# Patient Record
Sex: Female | Born: 1975
Health system: Southern US, Community
[De-identification: ages and names within clinical notes are randomized; demographics above are authoritative.]

## PROBLEM LIST (undated history)

## (undated) DIAGNOSIS — K8020 Calculus of gallbladder without cholecystitis without obstruction: Secondary | ICD-10-CM

## (undated) DIAGNOSIS — G8929 Other chronic pain: Secondary | ICD-10-CM

## (undated) DIAGNOSIS — R7989 Other specified abnormal findings of blood chemistry: Secondary | ICD-10-CM

## (undated) DIAGNOSIS — R109 Unspecified abdominal pain: Secondary | ICD-10-CM

## (undated) DIAGNOSIS — R945 Abnormal results of liver function studies: Secondary | ICD-10-CM

## (undated) HISTORY — PX: TUBAL LIGATION: SHX77

---

## 2013-03-25 ENCOUNTER — Ambulatory Visit: Payer: Self-pay | Admitting: General Practice

## 2013-08-12 ENCOUNTER — Encounter: Payer: Self-pay | Admitting: *Deleted

## 2013-09-15 ENCOUNTER — Ambulatory Visit: Payer: Self-pay | Admitting: Gastroenterology

## 2013-10-24 ENCOUNTER — Encounter: Payer: Self-pay | Admitting: Gastroenterology

## 2013-10-24 ENCOUNTER — Ambulatory Visit (INDEPENDENT_AMBULATORY_CARE_PROVIDER_SITE_OTHER): Payer: Self-pay | Admitting: Gastroenterology

## 2013-10-24 ENCOUNTER — Encounter (INDEPENDENT_AMBULATORY_CARE_PROVIDER_SITE_OTHER): Payer: Self-pay

## 2013-10-24 VITALS — BP 117/70 | HR 81 | Temp 98.4°F | Ht 61.0 in | Wt 158.2 lb

## 2013-10-24 DIAGNOSIS — R945 Abnormal results of liver function studies: Principal | ICD-10-CM

## 2013-10-24 DIAGNOSIS — R7989 Other specified abnormal findings of blood chemistry: Secondary | ICD-10-CM | POA: Insufficient documentation

## 2013-10-24 NOTE — Progress Notes (Signed)
      Primary Care Physician:  Jacquelin Hawking, PA Primary Gastroenterologist:  Dr. Darrick Penna   Chief Complaint  Patient presents with  . ELEVATED LIVER TESTS    HPI:   Laura Anderson is a pleasant 38 year old female presenting at the request of Jacquelin Hawking, [place], secondary to elevated LFTs. 2014 AST 58, ALT 72. May 2015 AST 77, ALT 100. Viral markers negative. Rare Aleve for headache. Sometimes chamomile tea. No OTC herbal supplements. No abdominal pain, N/V, loss of appetite, weight loss, upper or lower GI symptoms. No prior history of drug abuse, tattoos, blood transfusions.   Past Medical History  Diagnosis Date  . Medical history non-contributory     Past Surgical History  Procedure Laterality Date  . Tubal ligation      No current outpatient prescriptions on file.   No current facility-administered medications for this visit.    Allergies as of 10/24/2013  . (No Known Allergies)    Family History  Problem Relation Age of Onset  . Colon cancer Neg Hx   . Liver disease Neg Hx     History   Social History  . Marital Status: Unknown    Spouse Name: N/A    Number of Children: 5  . Years of Education: N/A   Occupational History  . Housewife    Social History Main Topics  . Smoking status: Never Smoker   . Smokeless tobacco: Not on file     Comment: NEVER SMOKED  . Alcohol Use: Yes     Comment: socially  . Drug Use: No  . Sexual Activity: Not on file   Other Topics Concern  . Not on file   Social History Narrative   5 children. Oldest is 23. Youngest 9.     Review of Systems: As mentioned in HPI  Physical Exam: BP 117/70  Pulse 81  Temp(Src) 98.4 F (36.9 C) (Oral)  Ht  (1.549 m)  Wt 158 lb 3.2 oz (71.759 kg)  BMI 29.91 kg/m2  LMP 10/10/2013 General:   Alert and oriented. Pleasant and cooperative. Well-nourished and well-developed.  Head:  Normocephalic and atraumatic. Eyes:  Without icterus, sclera clear and conjunctiva pink.    Ears:  Normal auditory acuity. Nose:  No deformity, discharge,  or lesions. Mouth:  No deformity or lesions, oral mucosa pink.  Lungs:  Clear to auscultation bilaterally. No wheezes, rales, or rhonchi. No distress.  Heart:  S1, S2 present without murmurs appreciated.  Abdomen:  +BS, soft, non-tender and non-distended. Liver margin smooth, palpable 2-3 cm below right subcostal margin.. No guarding or rebound. No masses appreciated.  Rectal:  Deferred  Msk:  Symmetrical without gross deformities. Normal posture. Extremities:  Without clubbing or edema. Neurologic:  Alert and  oriented x4;  grossly normal neurologically. Skin:  Intact without significant lesions or rashes. Cervical Nodes:  No significant cervical adenopathy. Psych:  Alert and cooperative. Normal mood and affect.

## 2013-10-24 NOTE — Assessment & Plan Note (Signed)
[age]year old female with mild persistent elevation in transaminases with negative viral markers. No imaging on file, completely asymptomatic. Awaiting  Assistance. Will proceed with further serologies, [place] of abdomen. Query fatty liver as culprit. Labs to be abstracted. Refer to nutrition to assist with healthy alternatives for food while still keeping with cultural practices.

## 2013-10-24 NOTE — Patient Instructions (Signed)
I have ordered an ultrasound of your liver for further assessment and blood work.  Follow a low-fat diet and exercise most days of the week for at least 30 minutes. I have referred you to a nutritionist to help with meal ideas.   Fat and Cholesterol Control Diet Fat and cholesterol levels in your blood and organs are influenced by your diet. High levels of fat and cholesterol may lead to diseases of the heart, small and large blood vessels, gallbladder, liver, and pancreas. CONTROLLING FAT AND CHOLESTEROL WITH DIET Although exercise and lifestyle factors are important, your diet is key. That is because certain foods are known to raise cholesterol and others to lower it. The goal is to balance foods for their effect on cholesterol and more importantly, to replace saturated and trans fat with other types of fat, such as monounsaturated fat, polyunsaturated fat, and omega-3 fatty acids. On average, a person should consume no more than 15 to 17 g of saturated fat daily. Saturated and trans fats are considered "bad" fats, and they will raise LDL cholesterol. Saturated fats are primarily found in animal products such as meats, butter, and cream. However, that does not mean you need to give up all your favorite foods. Today, there are good tasting, low-fat, low-cholesterol substitutes for most of the things you like to eat. Choose low-fat or nonfat alternatives. Choose round or loin cuts of red meat. These types of cuts are lowest in fat and cholesterol. Chicken (without the skin), fish, veal, and ground [place] breast are great choices. Eliminate fatty meats, such as hot dogs and salami. Even shellfish have little or no saturated fat. Have a 3 oz (85 g) portion when you eat lean meat, poultry, or fish. Trans fats are also called "partially hydrogenated oils." They are oils that have been scientifically manipulated so that they are solid at room temperature resulting in a longer shelf life and improved taste and  texture of foods in which they are added. Trans fats are found in stick margarine, some tub margarines, cookies, crackers, and baked goods.  When baking and cooking, oils are a great substitute for butter. The monounsaturated oils are especially beneficial since it is believed they lower LDL and raise HDL. The oils you should avoid entirely are saturated tropical oils, such as coconut and palm.  Remember to eat a lot from food groups that are naturally free of saturated and trans fat, including fish, fruit, vegetables, beans, grains (barley, rice, couscous, bulgur wheat), and pasta (without cream sauces).  IDENTIFYING FOODS THAT LOWER FAT AND CHOLESTEROL  Soluble fiber may lower your cholesterol. This type of fiber is found in fruits such as apples, vegetables such as broccoli, potatoes, and carrots, legumes such as beans, peas, and lentils, and grains such as barley. Foods fortified with plant sterols (phytosterol) may also lower cholesterol. You should eat at least 2 g per day of these foods for a cholesterol lowering effect.  Read package labels to identify low-saturated fats, trans fat free, and low-fat foods at the supermarket. Select cheeses that have only 2 to 3 g saturated fat per ounce. Use a heart-healthy tub margarine that is free of trans fats or partially hydrogenated oil. When buying baked goods (cookies, crackers), avoid partially hydrogenated oils. Breads and muffins should be made from whole grains (whole-wheat or whole oat flour, instead of "flour" or "enriched flour"). Buy non-creamy canned soups with reduced salt and no added fats.  FOOD PREPARATION TECHNIQUES  Never deep-fry. If you must  fry, either stir-fry, which uses very little fat, or use non-stick cooking sprays. When possible, broil, bake, or roast meats, and steam vegetables. Instead of putting butter or margarine on vegetables, use lemon and herbs, applesauce, and cinnamon (for squash and sweet potatoes). Use nonfat yogurt,  salsa, and low-fat dressings for salads.  LOW-SATURATED FAT / LOW-FAT FOOD SUBSTITUTES Meats / Saturated Fat (g)  Avoid: Steak, marbled (3 oz/85 g) / 11 g  Choose: Steak, lean (3 oz/85 g) / 4 g  Avoid: Hamburger (3 oz/85 g) / 7 g  Choose: Hamburger, lean (3 oz/85 g) / 5 g  Avoid: Ham (3 oz/85 g) / 6 g  Choose: Ham, lean cut (3 oz/85 g) / 2.4 g  Avoid: Chicken, with skin, dark meat (3 oz/85 g) / 4 g  Choose: Chicken, skin removed, dark meat (3 oz/85 g) / 2 g  Avoid: Chicken, with skin, light meat (3 oz/85 g) / 2.5 g  Choose: Chicken, skin removed, light meat (3 oz/85 g) / 1 g Dairy / Saturated Fat (g)  Avoid: Whole milk (1 cup) / 5 g  Choose: Low-fat milk, 2% (1 cup) / 3 g  Choose: Low-fat milk, 1% (1 cup) / 1.5 g  Choose: Skim milk (1 cup) / 0.3 g  Avoid: Hard cheese (1 oz/28 g) / 6 g  Choose: Skim milk cheese (1 oz/28 g) / 2 to 3 g  Avoid: Cottage cheese, 4% fat (1 cup) / 6.5 g  Choose: Low-fat cottage cheese, 1% fat (1 cup) / 1.5 g  Avoid: Ice cream (1 cup) / 9 g  Choose: Sherbet (1 cup) / 2.5 g  Choose: Nonfat frozen yogurt (1 cup) / 0.3 g  Choose: Frozen fruit bar / trace  Avoid: Whipped cream (1 tbs) / 3.5 g  Choose: Nondairy whipped topping (1 tbs) / 1 g Condiments / Saturated Fat (g)  Avoid: Mayonnaise (1 tbs) / 2 g  Choose: Low-fat mayonnaise (1 tbs) / 1 g  Avoid: Butter (1 tbs) / 7 g  Choose: Extra light margarine (1 tbs) / 1 g  Avoid: Coconut oil (1 tbs) / 11.8 g  Choose: Olive oil (1 tbs) / 1.8 g  Choose: Corn oil (1 tbs) / 1.7 g  Choose: Safflower oil (1 tbs) / 1.2 g  Choose: Sunflower oil (1 tbs) / 1.4 g  Choose: Soybean oil (1 tbs) / 2.4 g  Choose: Canola oil (1 tbs) / 1 g Document Released: 02/13/2005 Document Revised: 06/10/2012 Document Reviewed: 05/14/2013 Exit[employer] Patient Information 2015 [place]. This information is not intended to replace advice given to you by your health care provider. Make sure you discuss any  questions you have with your health care provider.

## 2013-10-29 NOTE — Progress Notes (Signed)
Cc to pcp °

## 2013-11-11 LAB — HEPATITIS B SURFACE ANTIGEN
HEPATITIS B SURFACE ANTIGEN: NEGATIVE
Hep A IgM: NEGATIVE
Hep B C IgM: NEGATIVE
Hepatitis C Ab: NEGATIVE

## 2013-11-11 LAB — COMPREHENSIVE METABOLIC PANEL
ALT: 100
ALT: 72
AST: 58 U/L
AST: 77 U/L

## 2014-01-01 ENCOUNTER — Telehealth: Payer: Self-pay | Admitting: Gastroenterology

## 2014-01-01 NOTE — Telephone Encounter (Signed)
Pt called this morning to let us know that she had been approved for assistance and wanted to know if she could go ahead and be scheduled for whatever testing that needed to be done. Please advise if she needs another OV first. [phone]

## 2014-01-01 NOTE — Telephone Encounter (Signed)
AS what do we need to order for her now that she has [employer] assistance? Please advise

## 2014-01-02 ENCOUNTER — Other Ambulatory Visit: Payer: Self-pay

## 2014-01-02 NOTE — Telephone Encounter (Signed)
Pt is aware to have labs done. Orders have been faxed to the lab

## 2014-01-02 NOTE — Telephone Encounter (Signed)
Good to hear that! Needs an ultrasound of abdomen and the labs completed that are ordered in epic.

## 2014-01-02 NOTE — Telephone Encounter (Signed)
She is also set up for the US on 01/05/14 @ 1045 and she is aware

## 2014-01-05 ENCOUNTER — Other Ambulatory Visit (HOSPITAL_COMMUNITY): Payer: Self-pay

## 2014-01-05 ENCOUNTER — Ambulatory Visit (HOSPITAL_COMMUNITY)
Admission: RE | Admit: 2014-01-05 | Discharge: 2014-01-05 | Disposition: A | Discharge status: Home / Self Care | Payer: Self-pay | Source: Ambulatory Visit | Attending: Gastroenterology | Admitting: Gastroenterology

## 2014-01-05 DIAGNOSIS — R945 Abnormal results of liver function studies: Secondary | ICD-10-CM

## 2014-01-05 DIAGNOSIS — R7989 Other specified abnormal findings of blood chemistry: Secondary | ICD-10-CM | POA: Insufficient documentation

## 2014-01-06 LAB — HEPATIC FUNCTION PANEL
ALT: 27 U/L (ref 0–35)
AST: 22 U/L (ref 0–37)
Albumin: 4.4 g/dL (ref 3.5–5.2)
Alkaline Phosphatase: 76 U/L (ref 39–117)
BILIRUBIN DIRECT: 0.2 mg/dL (ref 0.0–0.3)
BILIRUBIN INDIRECT: 1.1 mg/dL (ref 0.2–1.2)
BILIRUBIN TOTAL: 1.3 mg/dL — AB (ref 0.2–1.2)
Total Protein: 7.9 g/dL (ref 6.0–8.3)

## 2014-01-06 LAB — FERRITIN: Ferritin: 38 ng/mL (ref 10–291)

## 2014-01-06 LAB — IGG, IGA, IGM
IGA: 332 mg/dL (ref 69–380)
IgG (Immunoglobin G), Serum: 1880 mg/dL — ABNORMAL HIGH (ref 690–1700)
IgM, Serum: 172 mg/dL (ref 52–322)

## 2014-01-06 LAB — ANA: ANA: NEGATIVE

## 2014-01-06 LAB — IRON: Iron: 59 ug/dL (ref 42–145)

## 2014-01-06 LAB — MITOCHONDRIAL ANTIBODIES: MITOCHONDRIAL M2 AB, IGG: 0.17 (ref <0.91)

## 2014-01-08 ENCOUNTER — Telehealth: Payer: Self-pay | Admitting: Gastroenterology

## 2014-01-08 NOTE — Telephone Encounter (Signed)
PLEASE CALL PATIENT BACK REGARDING ABDOMINAL PAIN

## 2014-01-08 NOTE — Telephone Encounter (Signed)
Pt is aware that it takes 7-10 business days for results. Said she was just wondering how it would turn out.

## 2014-01-08 NOTE — Telephone Encounter (Signed)
Pt had called earlier for results. She is now asking about the pain she feels on her right side. The pain is just below the breast and goes around to the back area. She said the pain is not bad at all, she is just aware and it is intermittent.  Worsens a little when she is walking or standing. Please advise!

## 2014-01-08 NOTE — Telephone Encounter (Signed)
Pt had labs and U/S done on Tuesday this week and was asking if the results were back yet.. I told her that normally its 7-10 business days, but I would let the nurse know that she said called. [phone]

## 2014-01-09 NOTE — Progress Notes (Signed)
Quick Note:  Transaminases now normal.  IgG slightly elevated. Overall serologies for PBC/autoimmune hepatitis negative.  US abdomen with mobile gallstones. Fatty liver.  Would proceed with HIDA with FATTY MEAL CHALLENGE NOT CCK. ______

## 2014-01-09 NOTE — Progress Notes (Signed)
Quick Note:  Pt is aware and OK for Ginger to schedule the HIDA . ______

## 2014-01-09 NOTE — Telephone Encounter (Signed)
See result note.  

## 2014-01-09 NOTE — Telephone Encounter (Signed)
See result note. I would like a HIDA with fatty meal challenge, not CCK.  Symptoms seem to be more musculoskeletal but need to rule out biliary etiology.

## 2014-01-12 ENCOUNTER — Other Ambulatory Visit: Payer: Self-pay

## 2014-01-12 DIAGNOSIS — K8020 Calculus of gallbladder without cholecystitis without obstruction: Secondary | ICD-10-CM

## 2014-01-12 DIAGNOSIS — K760 Fatty (change of) liver, not elsewhere classified: Secondary | ICD-10-CM

## 2014-01-19 ENCOUNTER — Ambulatory Visit (HOSPITAL_COMMUNITY): Payer: Self-pay

## 2014-01-21 ENCOUNTER — Encounter (HOSPITAL_COMMUNITY): Payer: Self-pay

## 2014-01-21 ENCOUNTER — Other Ambulatory Visit: Payer: Self-pay

## 2014-01-21 ENCOUNTER — Ambulatory Visit (HOSPITAL_COMMUNITY)
Admission: RE | Admit: 2014-01-21 | Discharge: 2014-01-21 | Disposition: A | Discharge status: Home / Self Care | Payer: Self-pay | Source: Ambulatory Visit | Attending: Gastroenterology | Admitting: Gastroenterology

## 2014-01-21 DIAGNOSIS — K8020 Calculus of gallbladder without cholecystitis without obstruction: Secondary | ICD-10-CM | POA: Insufficient documentation

## 2014-01-21 DIAGNOSIS — R748 Abnormal levels of other serum enzymes: Secondary | ICD-10-CM

## 2014-01-21 DIAGNOSIS — R945 Abnormal results of liver function studies: Secondary | ICD-10-CM | POA: Insufficient documentation

## 2014-01-21 DIAGNOSIS — K760 Fatty (change of) liver, not elsewhere classified: Secondary | ICD-10-CM | POA: Insufficient documentation

## 2014-01-21 MED ORDER — TECHNETIUM TC 99M MEBROFENIN IV KIT
5.0000 | PACK | Freq: Once | INTRAVENOUS | Status: AC | PRN
  Administered 2014-01-21: 5 via INTRAVENOUS

## 2014-01-21 NOTE — Progress Notes (Signed)
Quick Note:  Documented biliary dyskinesia. Please send to Dr. Lovell SheehanJenkins for evaluation for cholecystectomy ______

## 2014-03-26 ENCOUNTER — Emergency Department (HOSPITAL_COMMUNITY): Payer: Self-pay

## 2014-03-26 ENCOUNTER — Emergency Department (HOSPITAL_COMMUNITY)
Admission: EM | Admit: 2014-03-26 | Discharge: 2014-03-26 | Disposition: A | Discharge status: Home / Self Care | Payer: Self-pay | Attending: Emergency Medicine | Admitting: Emergency Medicine

## 2014-03-26 ENCOUNTER — Encounter (HOSPITAL_COMMUNITY): Payer: Self-pay | Admitting: Emergency Medicine

## 2014-03-26 DIAGNOSIS — Z3202 Encounter for pregnancy test, result negative: Secondary | ICD-10-CM | POA: Insufficient documentation

## 2014-03-26 DIAGNOSIS — R109 Unspecified abdominal pain: Secondary | ICD-10-CM | POA: Insufficient documentation

## 2014-03-26 DIAGNOSIS — Z9851 Tubal ligation status: Secondary | ICD-10-CM | POA: Insufficient documentation

## 2014-03-26 DIAGNOSIS — Z8719 Personal history of other diseases of the digestive system: Secondary | ICD-10-CM | POA: Insufficient documentation

## 2014-03-26 DIAGNOSIS — G8929 Other chronic pain: Secondary | ICD-10-CM | POA: Insufficient documentation

## 2014-03-26 HISTORY — DX: Calculus of gallbladder without cholecystitis without obstruction: K80.20

## 2014-03-26 HISTORY — DX: Unspecified abdominal pain: R10.9

## 2014-03-26 HISTORY — DX: Other chronic pain: G89.29

## 2014-03-26 HISTORY — DX: Abnormal results of liver function studies: R94.5

## 2014-03-26 HISTORY — DX: Other specified abnormal findings of blood chemistry: R79.89

## 2014-03-26 LAB — COMPREHENSIVE METABOLIC PANEL WITH GFR
ALT: 27 U/L (ref 0–35)
AST: 25 U/L (ref 0–37)
Albumin: 4.4 g/dL (ref 3.5–5.2)
Alkaline Phosphatase: 74 U/L (ref 39–117)
Anion gap: 8 (ref 5–15)
BUN: 12 mg/dL (ref 6–23)
CO2: 25 mmol/L (ref 19–32)
Calcium: 9.2 mg/dL (ref 8.4–10.5)
Chloride: 104 mmol/L (ref 96–112)
Creatinine, Ser: 0.53 mg/dL (ref 0.50–1.10)
GFR calc Af Amer: >90 mL/min
GFR calc non Af Amer: >90 mL/min
Glucose, Bld: 92 mg/dL (ref 70–99)
Potassium: 3.4 mmol/L — ABNORMAL LOW (ref 3.5–5.1)
Sodium: 137 mmol/L (ref 135–145)
Total Bilirubin: 2.3 mg/dL — ABNORMAL HIGH (ref 0.3–1.2)
Total Protein: 8.8 g/dL — ABNORMAL HIGH (ref 6.0–8.3)

## 2014-03-26 LAB — PREGNANCY, URINE: Preg Test, Ur: NEGATIVE

## 2014-03-26 LAB — CBC WITH DIFFERENTIAL/PLATELET
BASOS PCT: 0 % (ref 0–1)
Basophils Absolute: 0 10*3/uL (ref 0.0–0.1)
Eosinophils Absolute: 0.3 10*3/uL (ref 0.0–0.7)
Eosinophils Relative: 3 % (ref 0–5)
HEMATOCRIT: 38 % (ref 36.0–46.0)
Hemoglobin: 13.3 g/dL (ref 12.0–15.0)
LYMPHS PCT: 23 % (ref 12–46)
Lymphs Abs: 1.9 10*3/uL (ref 0.7–4.0)
MCH: 29.5 pg (ref 26.0–34.0)
MCHC: 35 g/dL (ref 30.0–36.0)
MCV: 84.3 fL (ref 78.0–100.0)
MONO ABS: 0.6 10*3/uL (ref 0.1–1.0)
Monocytes Relative: 8 % (ref 3–12)
Neutro Abs: 5.6 10*3/uL (ref 1.7–7.7)
Neutrophils Relative %: 66 % (ref 43–77)
Platelets: 260 10*3/uL (ref 150–400)
RBC: 4.51 MIL/uL (ref 3.87–5.11)
RDW: 12.6 % (ref 11.5–15.5)
WBC: 8.4 10*3/uL (ref 4.0–10.5)

## 2014-03-26 LAB — URINALYSIS, ROUTINE W REFLEX MICROSCOPIC
Bilirubin Urine: NEGATIVE
GLUCOSE, UA: NEGATIVE mg/dL
KETONES UR: 40 mg/dL — AB
NITRITE: NEGATIVE
Protein, ur: NEGATIVE mg/dL
SPECIFIC GRAVITY, URINE: 1.02 (ref 1.005–1.030)
UROBILINOGEN UA: 0.2 mg/dL (ref 0.0–1.0)
pH: 6 (ref 5.0–8.0)

## 2014-03-26 LAB — URINE MICROSCOPIC-ADD ON

## 2014-03-26 LAB — LIPASE, BLOOD: Lipase: 20 U/L (ref 11–59)

## 2014-03-26 MED ORDER — OXYCODONE-ACETAMINOPHEN 5-325 MG PO TABS: ORAL_TABLET | ORAL | Status: DC

## 2014-03-26 MED ORDER — ONDANSETRON HCL 4 MG PO TABS: 4.0000 mg | ORAL_TABLET | Freq: Three times a day (TID) | ORAL | Status: DC | PRN

## 2014-03-26 MED ORDER — OXYCODONE-ACETAMINOPHEN 5-325 MG PO TABS
2.0000 | ORAL_TABLET | Freq: Once | ORAL | Status: AC
  Administered 2014-03-26: 1 via ORAL
  Filled 2014-03-26: qty 2

## 2014-03-26 NOTE — ED Notes (Signed)
Patient state upper right flank pain that started today at 1500. Patient denies NVD or urinary symptoms.

## 2014-03-26 NOTE — ED Notes (Signed)
MD at bedside. 

## 2014-03-26 NOTE — ED Provider Notes (Signed)
CSN: [id number]     Arrival date & time 03/26/14  1836 History   First MD Initiated Contact with Patient 03/26/14 2030     Chief Complaint  Patient presents with  . Flank Pain      HPI Pt was seen at 2030.  Per pt, c/o gradual onset and persistence of constant right sided abd "pain" since 1500 today.  Has been associated with no other symptoms. Describes the abd pain as "stabbing."  Denies pain was related to food intake. Denies injury. Denies N/V, no diarrhea, no fevers, no back pain, no rash, no CP/SOB, no black or blood in stools, no dysuria/hematuria.       Past Medical History  Diagnosis Date  . Chronic abdominal pain   . Elevated LFTs   . Cholelithiasis    Past Surgical History  Procedure Laterality Date  . Tubal ligation     Family History  Problem Relation Age of Onset  . Colon cancer Neg Hx   . Liver disease Neg Hx    History  Substance Use Topics  . Smoking status: Never Smoker   . Smokeless tobacco: Not on file     Comment: NEVER SMOKED  . Alcohol Use: Yes     Comment: socially    Review of Systems ROS: Statement: All systems negative except as marked or noted in the HPI; Constitutional: Negative for fever and chills. ; ; Eyes: Negative for eye pain, redness and discharge. ; ; ENMT: Negative for ear pain, hoarseness, nasal congestion, sinus pressure and sore throat. ; ; Cardiovascular: Negative for chest pain, palpitations, diaphoresis, dyspnea and peripheral edema. ; ; Respiratory: Negative for cough, wheezing and stridor. ; ; Gastrointestinal: +abd pain. Negative for nausea, vomiting, diarrhea, blood in stool, hematemesis, jaundice and rectal bleeding. . ; ; Genitourinary:  Negative for dysuria and hematuria. ; ; Musculoskeletal: Negative for back pain and neck pain. Negative for swelling and trauma.; ; Skin: Negative for pruritus, rash, abrasions, blisters, bruising and skin lesion.; ; Neuro: Negative for headache, lightheadedness and neck stiffness. Negative for  weakness, altered level of consciousness , altered mental status, extremity weakness, paresthesias, involuntary movement, seizure and syncope.      Allergies  Review of patient's allergies indicates no known allergies.  Home Medications   Prior to Admission medications   Medication Sig Start Date End Date Taking? Authorizing Provider  naproxen sodium (ANAPROX) 220 MG tablet Take 220-440 mg by mouth daily as needed (for pain).   Yes Historical Provider, MD   BP 126/79 mmHg  Pulse 78  Temp(Src) 98.8 F (37.1 C) (Oral)  Resp 20  Ht 5\' 1"  (1.549 m)  Wt 151 lb 14.4 oz (68.901 kg)  BMI 28.72 kg/m2  SpO2 100%  LMP 03/26/2014 Physical Exam  2035: Physical examination:  Nursing notes reviewed; Vital signs and O2 SAT reviewed;  Constitutional: Well developed, Well nourished, Well hydrated, In no acute distress; Head:  Normocephalic, atraumatic; Eyes: EOMI, PERRL, No scleral icterus; ENMT: Mouth and pharynx normal, Mucous membranes moist; Neck: Supple, Full range of motion, No lymphadenopathy; Cardiovascular: Regular rate and rhythm, No murmur, rub, or gallop; Respiratory: Breath sounds clear & equal bilaterally, No rales, rhonchi, wheezes.  Speaking full sentences with ease, Normal respiratory effort/excursion; Chest: Nontender, Movement normal; Abdomen: Soft, +right sided torso tenderness to palp. No rash. No abd tenderness. Negative Murphy's sign. Nondistended, Normal bowel sounds; Genitourinary: No CVA tenderness; Spine:  No midline CS, TS, LS tenderness. No rash.;; Extremities: Pulses normal, No tenderness, No  edema, No calf edema or asymmetry.; Neuro: AA&Ox3, Major CN grossly intact.  Speech clear. No gross focal motor or sensory deficits in extremities. Climbs on and off stretcher easily by herself. Gait steady.; Skin: Color normal, Warm, Dry.   ED Course  Procedures     EKG Interpretation None      MDM  MDM Reviewed: previous chart, nursing note and vitals Reviewed previous:  labs Interpretation: labs and x-ray   Results for orders placed or performed during the hospital encounter of 03/26/14  CBC with Differential  Result Value Ref Range   WBC 8.4 4.0 - 10.5 K/uL   RBC 4.51 3.87 - 5.11 MIL/uL   Hemoglobin 13.3 12.0 - 15.0 g/dL   HCT 86.5 78.4 - 69.6 %   MCV 84.3 78.0 - 100.0 fL   MCH 29.5 26.0 - 34.0 pg   MCHC 35.0 30.0 - 36.0 g/dL   RDW 29.5 28.4 - 13.2 %   Platelets 260 150 - 400 K/uL   Neutrophils Relative % 66 43 - 77 %   Neutro Abs 5.6 1.7 - 7.7 K/uL   Lymphocytes Relative 23 12 - 46 %   Lymphs Abs 1.9 0.7 - 4.0 K/uL   Monocytes Relative 8 3 - 12 %   Monocytes Absolute 0.6 0.1 - 1.0 K/uL   Eosinophils Relative 3 0 - 5 %   Eosinophils Absolute 0.3 0.0 - 0.7 K/uL   Basophils Relative 0 0 - 1 %   Basophils Absolute 0.0 0.0 - 0.1 K/uL  Comprehensive metabolic panel  Result Value Ref Range   Sodium 137 135 - 145 mmol/L   Potassium 3.4 (L) 3.5 - 5.1 mmol/L   Chloride 104 96 - 112 mmol/L   CO2 25 19 - 32 mmol/L   Glucose, Bld 92 70 - 99 mg/dL   BUN 12 6 - 23 mg/dL   Creatinine, Ser 4.40 0.50 - 1.10 mg/dL   Calcium 9.2 8.4 - 10.2 mg/dL   Total Protein 8.8 (H) 6.0 - 8.3 g/dL   Albumin 4.4 3.5 - 5.2 g/dL   AST 25 0 - 37 U/L   ALT 27 0 - 35 U/L   Alkaline Phosphatase 74 39 - 117 U/L   Total Bilirubin 2.3 (H) 0.3 - 1.2 mg/dL   GFR calc non Af Amer >90 >90 mL/min   GFR calc Af Amer >90 >90 mL/min   Anion gap 8 5 - 15  Urinalysis, Routine w reflex microscopic  Result Value Ref Range   Color, Urine YELLOW YELLOW   APPearance CLEAR CLEAR   Specific Gravity, Urine 1.020 1.005 - 1.030   pH 6.0 5.0 - 8.0   Glucose, UA NEGATIVE NEGATIVE mg/dL   Hgb urine dipstick LARGE (A) NEGATIVE   Bilirubin Urine NEGATIVE NEGATIVE   Ketones, ur 40 (A) NEGATIVE mg/dL   Protein, ur NEGATIVE NEGATIVE mg/dL   Urobilinogen, UA 0.2 0.0 - 1.0 mg/dL   Nitrite NEGATIVE NEGATIVE   Leukocytes, UA SMALL (A) NEGATIVE  Pregnancy, urine  Result Value Ref Range   Preg  Test, Ur NEGATIVE NEGATIVE  Lipase, blood  Result Value Ref Range   Lipase 20 11 - 59 U/L  Urine microscopic-add on  Result Value Ref Range   Squamous Epithelial / LPF FEW (A) RARE   WBC, UA 0-2 <3 WBC/hpf   RBC / HPF 11-20 <3 RBC/hpf   Urine-Other AMORPHOUS URATES/PHOSPHATES    Ct Renal Stone Study 03/26/2014   CLINICAL DATA:  Initial evaluation for upper right  flank pain since 3 p.m. today. History of cholelithiasis common chronic abdominal pain, elevated LFTs. Also tubal ligation.  EXAM: CT ABDOMEN AND PELVIS WITHOUT CONTRAST  TECHNIQUE: Multidetector CT imaging of the abdomen and pelvis was performed following the standard protocol without IV contrast.  COMPARISON:  Prior ultrasound from 01/05/2014.  FINDINGS: Visualized lung bases are clear. No pleural or pericardial effusion.  Limited noncontrast evaluation of the liver is unremarkable. Multiple stones present within the gallbladder lumen. No CT evidence for acute cholecystitis. No biliary dilatation. The spleen, adrenal glands, and pancreas demonstrate a normal unenhanced appearance.  Kidneys are equal size without evidence of nephrolithiasis or hydronephrosis. No stones seen along the course of either renal collecting system. There is no hydroureter. He  Stomach and within normal limits. No evidence for bowel obstruction. No abnormal wall thickening or inflammatory changes seen about the bowels. Appendix visualized in the right lower quadrant and is of normal caliber and appearance without associated inflammatory changes to suggest acute appendicitis.  Bladder within normal limits.  Uterus and ovaries are normal.  No free air or fluid.  No adenopathy.  No acute osseus abnormality. No worrisome lytic or blastic osseous lesion.  IMPRESSION: 1. No CT evidence for nephrolithiasis or obstructive uropathy. 2. Cholelithiasis without evidence for acute cholecystitis or biliary dilatation. 3. No other acute intra-abdominal or pelvic process. 4. Normal  appendix.   Electronically Signed   By: Rise Mu M.D.   On: 03/26/2014 21:20    2140:  Pt has tol PO well while in the ED without N/V.  No stooling while in the ED.  Abd remains benign, VSS. Feels better and wants to go home now. Workup reassuring. Tx symptomatically at this time. Dx and testing d/w pt and family.  Questions answered.  Verb understanding, agreeable to d/c home with outpt f/u.     Samuel Jester, DO 03/30/14 1211

## 2014-03-26 NOTE — Discharge Instructions (Signed)
°Emergency Department Resource Guide °1) Find a Doctor and Pay Out of Pocket °Although you won't have to find out who is covered by your insurance plan, it is a good idea to ask around and get recommendations. You will then need to call the office and see if the doctor you have chosen will accept you as a new patient and what types of options they offer for patients who are self-pay. Some doctors offer discounts or will set up payment plans for their patients who do not have insurance, but you will need to ask so you aren't surprised when you get to your appointment. ° °2) Contact Your Local Health Department °Not all health departments have doctors that can see patients for sick visits, but many do, so it is worth a call to see if yours does. If you don't know where your local health department is, you can check in your phone book. The CDC also has a tool to help you locate your state's health department, and many state websites also have listings of all of their local health departments. ° °3) Find a Walk-in Clinic °If your illness is not likely to be very severe or complicated, you may want to try a walk in clinic. These are popping up all over the country in pharmacies, drugstores, and shopping centers. They're usually staffed by nurse practitioners or physician assistants that have been trained to treat common illnesses and complaints. They're usually fairly quick and inexpensive. However, if you have serious medical issues or chronic medical problems, these are probably not your best option. ° °No Primary Care Doctor: °- Call [facility] Connect at  832-8000 - they can help you locate a primary care doctor that  accepts your insurance, provides certain services, etc. °- Physician Referral Service- 1-800-533-3463 ° °Chronic Pain Problems: °Organization         Address  Phone   Notes  °[facility]  (336) 297-2271 Patients need to be referred by their primary care doctor.  ° °Medication  Assistance: °Organization         Address  Phone   Notes  °[place] [facility] [address] °[place], [place] [address] (336) 641-8030 --Must be a resident of [place] °-- Must have NO insurance coverage whatsoever (no Medicaid/ Medicare, etc.) °-- The pt. MUST have a primary care doctor that directs their care regularly and follows them in the community °  °[facility]  (866) 331-1348   °[employer]  (888) 892-1162   ° °Agencies that provide inexpensive medical care: °Organization         Address  Phone   Notes  °[facility]  (336) 832-8035   °[facility]    (336) 832-7272   °[facility] [address] °[place], [place] [address] (336) 832-4777   °[facility] [address], °[place] (336) 271-4999   °[facility]    (336) 373-0678   °[facility]    (336) 272-1050   °[facility] ° [address], [place] Phone:  (336) 832-4444, Fax:  (336) 832-4440 Hours of Operation:  9 am - 6 pm, M-F.  Also accepts Medicaid/Medicare and self-pay.  °[facility] ° [address], [place] Phone: (336) 832-3150, Fax: (336) [phone]. Hours of Operation:  8:30 am - 5:30 pm, M-F.  Also accepts Medicaid and self-pay.  °[facility] [place] [address], [place] Phone: (336) 878-6027   °[facility] [address], [place], [place] (336)723-1848, Ext. 123 Mondays & Thursdays: 7-9 AM.  First 15 patients are seen on a first come, first serve basis. °  ° °Medicaid-accepting [place] Providers: ° °Organization         Address  Phone   Notes  °[facility] [address], [place] (336) 641-2100 Also accepts self-pay patients.  °[facility] [address], [place] ° (336) 856-9996   °[facility] [address], [place]  (336) 288-8857   °[facility] [address] [place] [address], [place] (336) 299-7000   °Veita Bland [address], [place]  ° (336) 373-1557 Only accepts [place] Access Medicaid patients after they have their name applied to their card.  ° °Self-Pay (no insurance) in [place]: ° °Organization         Address  Phone   Notes  °Sickle Cell Patients, [facility] [address], [place] (336) 832-1970   °[facility] [address], [place] (336) 832-4400   °[facility] ° [address], [place] (336) 992-4800   °[facility]/Dr. Osei-Bonsu ° [address] [place] [address], [place] or [address], [place] (336) 841-8500 Phone number for both [place] and [place] locations is the same.  °[facility] [address], [place] (336) 299-0000   °[facility] [address] [place] [address], [place] or [address] (336) 852-7530 °(336) 878-2260   °[facility] [address], [place] (336) 350-1642, phone; (336) 294-5005, fax Sees patients 1st and 3rd Saturday of every month.  Must not qualify for public or private insurance (i.e. Medicaid, Medicare, [place] Health Choice, Veterans' Benefits) • Household income should be no more than 200% of the poverty level •The clinic cannot treat you if you are pregnant or think you are pregnant • Sexually transmitted diseases are not treated at the clinic.  ° ° °Dental Care: °Organization         Address  Phone  Notes  °[place] [facility] [address], [place] (336) 641-6152 Accepts children up to age 21 who are enrolled in Medicaid or [place] Health Choice; pregnant women with a Medicaid card; and children who have applied for Medicaid or [place] Health Choice, but were declined, whose parents can pay a reduced fee at time of service.  °[place]  [facility] [place]  [address], [place] (336) 641-7733 Accepts children up to age 21 who are enrolled in Medicaid or [place] Health Choice; pregnant women with a Medicaid card; and children who have applied for Medicaid or [place] Health Choice, but were declined, whose parents can pay a reduced fee at time of service.  °[facility] ° [address], [place] (336) 641-4533 Patients are seen by appointment only. Walk-ins are not accepted. [facility] will see patients 18 years of age and older. °Monday - Tuesday (8am-5pm) °Most Wednesdays (8:30-5pm) °$30 per visit, cash only  °[facility] ° [address], [place] (336) 641-4533 Patients are seen by appointment only. Walk-ins are not accepted. [facility] will see patients 18 years of age and older. °One   Wednesday Evening (Monthly: Volunteer Based).  $30 per visit, cash only  °[facility]  (919) 537-3737 for adults; Children under age 4, call Graduate Pediatric Dentistry at ([phone]) [phone]. Children aged 4-14, please call ([phone]) [phone] to request a pediatric application. ° Dental services are provided in all areas of dental care including fillings, crowns and bridges, complete and partial dentures, implants, gum treatment, root canals, and extractions. Preventive care is also provided. Treatment is provided to both adults and children. °Patients are selected via a lottery and there is often a waiting list. °  °[facility] [address], °[place] ° (336) 763-8833 [email or web address] °  °[facility] [address], [place], [place] (336)723-1848, Ext. 123 Second and Fourth Thursday of each month, opens at 6:30 AM; Clinic ends at 9 AM.  Patients are seen on a first-come first-served basis, and a limited number are seen during each clinic.  ° °[facility] ° [address], [place], [place] (336) 723-7904    Eligibility Requirements °You must have lived in [place], [place], or [place] counties for at least the last three months. °  You cannot be eligible for state or federal sponsored healthcare insurance, including Veterans Administration, Medicaid, or Medicare. °  You generally cannot be eligible for healthcare insurance through your employer.  °  How to apply: °Eligibility screenings are held every Tuesday and Wednesday afternoon from 1:00 pm until 4:00 pm. You do not need an appointment for the interview!  °[facility] [address], [place], [place] 336-631-2330   °[facility]  336-342-8273   °[facility]  336-703-3100   °[facility]  336-570-6415   ° °Behavioral Health Resources in the Community: °Intensive Outpatient Programs °Organization         Address  Phone  Notes  °[facility] [address], [place], [place] 336-878-6098   °[facility] [address], [place], [place] 336-832-9800   °[facility] [address], [place], [place] ° 336-882-2125   °[facility] [address],  °[place], [place] 1-800-853-5163 or 336-641-4981   °Substance Abuse Resources °Organization         Address  Phone  Notes  °[facility]  336-882-2125   °[facility]  336-784-9470   °[facility]  336-285-9073   °[facility]  336-845-3988   °Residential & Outpatient Substance Abuse Program  1-800-659-3381   °Psychological Services °Organization         Address  Phone  Notes  °[facility]  336- 832-9600   °[facility]  336- 378-7881   °[facility] [address], [place] 1-800-853-5163 or 336-641-4981   ° °Mobile Crisis Teams °Organization         Address  Phone  Notes  °[facility], Mobile Crisis Care Unit  1-877-626-1772   °Assertive °[facility] ° [address]  [place], [place] 336-834-9664   °Sharon DeEsch [address] °[place] 336-554-5454   ° °Self-Help/Support Groups °Organization         Address  Phone             Notes  °[facility] of  - variety of support groups  336- 373-1402 Call for more information  °Narcotics Anonymous (NA), [facility] [address], °[place]  2 meetings at this location  ° °  Residential Treatment Programs Organization         Address  Phone  Notes  [facility] [address],    [place]  [phone]   [facility]  [address], [place] [phone], [place], [place] [phone]   [facility] [address], [place] [phone] Admissions: 8am-3pm M-F  [facility] [address]    [place], [place] [phone]   [facility] [address], [place], [place] [phone]   [facility] [address]  [place], [place] [phone]   [facility] Intensive Outpatient [address], [place], [place] [phone]   [facility] [address]  [place], [place] [phone] or [phone]   [facility] ([facility]) [address] [place], [place] [phone] Accepts Medicaid  [facility] [address]  [place] [phone] Substance Abuse/Addiction Treatment   [facility] Organization         Address  Phone  Notes  [facility]  (678) [phone](888) [phone]   Angie FavaJulie Brannon, PhD [address], [address] [place], [place]   (228)[phone](336) [phone] or ([phone](336) [phone]   [facility]   [address] [place], [place] (229) [phone](336) [phone]   [facility] [address], [place], [place] ([phone](336) [phone] Insurance/Medicaid/sponsorship through [facility] [address]                                    [place], [place] [phone](336) [phone] Therapy/tele-psych/case    [facility] [address]   [place], [place] [phone]) [phone]    Dr. Lolly MustacheArfeen  (724)[phone](336) [phone]   [facility]  [employer] [facility] 1) [address], [place] 2) [address], [place] 3)  [address], [place] ([phone]) [phone](336) [phone](336) [phone]  [phone](336) ([phone]   [facility] Child [facility] Hot[facility] 803-623-3278(336) 903-492-3380 or [phone](336) ([phone] (After Hours)      Eat a bland diet, avoiding greasy, fatty, fried foods, as well as spicy and acidic foods or beverages.  Avoid eating within the hour or 2 before going to bed or laying down.  Also avoid teas, colas, coffee, chocolate, pepermint and spearment.  Take the prescriptions as directed.  Call your regular medical doctor tomorrow to schedule a follow up appointment this week. Call your GI doctor and the General Surgeon tomorrow to schedule a follow up appointment within the next week.  Return to the Emergency Department immediately if worsening.

## 2014-05-05 ENCOUNTER — Encounter (HOSPITAL_COMMUNITY)
Admission: RE | Admit: 2014-05-05 | Discharge: 2014-05-05 | Disposition: A | Discharge status: Home / Self Care | Payer: BLUE CROSS/BLUE SHIELD | Source: Ambulatory Visit | Attending: Orthopedic Surgery | Admitting: Orthopedic Surgery

## 2014-05-05 ENCOUNTER — Encounter (HOSPITAL_COMMUNITY): Payer: Self-pay

## 2014-05-05 DIAGNOSIS — K8020 Calculus of gallbladder without cholecystitis without obstruction: Secondary | ICD-10-CM | POA: Diagnosis present

## 2014-05-05 DIAGNOSIS — K8010 Calculus of gallbladder with chronic cholecystitis without obstruction: Secondary | ICD-10-CM | POA: Diagnosis not present

## 2014-05-05 LAB — CBC WITH DIFFERENTIAL/PLATELET
Basophils Absolute: 0 10*3/uL (ref 0.0–0.1)
Basophils Relative: 0 % (ref 0–1)
Eosinophils Absolute: 0.2 10*3/uL (ref 0.0–0.7)
Eosinophils Relative: 4 % (ref 0–5)
HCT: 38.2 % (ref 36.0–46.0)
HEMOGLOBIN: 13.2 g/dL (ref 12.0–15.0)
Lymphocytes Relative: 28 % (ref 12–46)
Lymphs Abs: 1.8 10*3/uL (ref 0.7–4.0)
MCH: 29.2 pg (ref 26.0–34.0)
MCHC: 34.6 g/dL (ref 30.0–36.0)
MCV: 84.5 fL (ref 78.0–100.0)
Monocytes Absolute: 0.6 10*3/uL (ref 0.1–1.0)
Monocytes Relative: 9 % (ref 3–12)
NEUTROS PCT: 59 % (ref 43–77)
Neutro Abs: 3.8 10*3/uL (ref 1.7–7.7)
Platelets: 273 10*3/uL (ref 150–400)
RBC: 4.52 MIL/uL (ref 3.87–5.11)
RDW: 12.9 % (ref 11.5–15.5)
WBC: 6.5 10*3/uL (ref 4.0–10.5)

## 2014-05-05 LAB — BASIC METABOLIC PANEL
Anion gap: 7 (ref 5–15)
BUN: 11 mg/dL (ref 6–23)
CO2: 26 mmol/L (ref 19–32)
Calcium: 9.4 mg/dL (ref 8.4–10.5)
Chloride: 105 mmol/L (ref 96–112)
Creatinine, Ser: 0.58 mg/dL (ref 0.50–1.10)
GFR calc non Af Amer: >90 mL/min (ref >90)
Glucose, Bld: 107 mg/dL — ABNORMAL HIGH (ref 70–99)
Potassium: 4.5 mmol/L (ref 3.5–5.1)
Sodium: 138 mmol/L (ref 135–145)

## 2014-05-05 LAB — HEPATIC FUNCTION PANEL
ALBUMIN: 4.3 g/dL (ref 3.5–5.2)
ALK PHOS: 69 U/L (ref 39–117)
ALT: 21 U/L (ref 0–35)
AST: 22 U/L (ref 0–37)
Bilirubin, Direct: 0.3 mg/dL (ref 0.0–0.5)
Indirect Bilirubin: 1.2 mg/dL — ABNORMAL HIGH (ref 0.3–0.9)
TOTAL PROTEIN: 8.1 g/dL (ref 6.0–8.3)
Total Bilirubin: 1.5 mg/dL — ABNORMAL HIGH (ref 0.3–1.2)

## 2014-05-05 LAB — HCG, SERUM, QUALITATIVE: Preg, Serum: NEGATIVE

## 2014-05-05 NOTE — Patient Instructions (Signed)
Royal HawthornCristina Iwan  05/05/2014   Your procedure is scheduled on:  05/08/2014  [facility].  Call this number if you have problems the morning of surgery: [phone]   Remember:   Do not eat food or drink liquids after midnight.   Take these medicines the morning of surgery with A SIP OF WATER:  Zofran, oxycodone   Do not wear jewelry, make-up or nail polish.  Do not wear lotions, powders, or perfumes.   Do not shave 48 hours prior to surgery. Men may shave face and neck.  Do not bring valuables to the hospital.  [facility] is not responsible for any belongings or valuables.               Contacts, dentures or bridgework may not be worn into surgery.  Leave suitcase in the car. After surgery it may be brought to your room.  For patients admitted to the hospital, discharge time is determined by your treatment team.               Patients discharged the day of surgery will not be allowed to drive home.  Name and phone number of your driver: family  Special Instructions: Shower using CHG 2 nights before surgery and the night before surgery.  If you shower the day of surgery use CHG.  Use special wash - you have one bottle of CHG for all showers.  You should use approximately 1/3 of the bottle for each shower.   Please read over the following fact sheets that you were given: Pain Booklet, Coughing and Deep Breathing, Surgical Site Infection Prevention, Anesthesia Post-op Instructions and Care and Recovery After Surgery Laparoscopic Cholecystectomy Laparoscopic cholecystectomy is surgery to remove the gallbladder. The gallbladder is located in the upper right part of the abdomen, behind the liver. It is a storage sac for bile produced in the liver. Bile aids in the digestion and absorption of fats. Cholecystectomy is often done for inflammation of the gallbladder (cholecystitis). This condition is usually caused by a buildup of gallstones (cholelithiasis) in your  gallbladder. Gallstones can block the flow of bile, resulting in inflammation and pain. In severe cases, emergency surgery may be required. When emergency surgery is not required, you will have time to prepare for the procedure. Laparoscopic surgery is an alternative to open surgery. Laparoscopic surgery has a shorter recovery time. Your common bile duct may also need to be examined during the procedure. If stones are found in the common bile duct, they may be removed. LET [facility] PROVIDER KNOW ABOUT:  Any allergies you have.  All medicines you are taking, including vitamins, herbs, eye drops, creams, and over-the-counter medicines.  Previous problems you or members of your family have had with the use of anesthetics.  Any blood disorders you have.  Previous surgeries you have had.  Medical conditions you have. RISKS AND COMPLICATIONS Generally, this is a safe procedure. However, as with any procedure, complications can occur. Possible complications include:  Infection.  Damage to the common bile duct, nerves, arteries, veins, or other internal organs such as the stomach, liver, or intestines.  Bleeding.  A stone may remain in the common bile duct.  A bile leak from the cyst duct that is clipped when your gallbladder is removed.  The need to convert to open surgery, which requires a larger incision in the abdomen. This may be necessary if your surgeon thinks it is  not safe to continue with a laparoscopic procedure. BEFORE THE PROCEDURE  Ask your health care provider about changing or stopping any regular medicines. You will need to stop taking aspirin or blood thinners at least 5 days prior to surgery.  Do not eat or drink anything after midnight the night before surgery.  Let your health care provider know if you develop a cold or other infectious problem before surgery. PROCEDURE   You will be given medicine to make you sleep through the procedure (general  anesthetic). A breathing tube will be placed in your mouth.  When you are asleep, your surgeon will make several small cuts (incisions) in your abdomen.  A thin, lighted tube with a tiny camera on the end (laparoscope) is inserted through one of the small incisions. The camera on the laparoscope sends a picture to a TV screen in the operating room. This gives the surgeon a good view inside your abdomen.  A gas will be pumped into your abdomen. This expands your abdomen so that the surgeon has more room to perform the surgery.  Other tools needed for the procedure are inserted through the other incisions. The gallbladder is removed through one of the incisions.  After the removal of your gallbladder, the incisions will be closed with stitches, staples, or skin glue. AFTER THE PROCEDURE  You will be taken to a recovery area where your progress will be checked often.  You may be allowed to go home the same day if your pain is controlled and you can tolerate liquids. Document Released: 02/13/2005 Document Revised: 12/04/2012 Document Reviewed: 09/25/2012 [facility] Patient Information 2015 [place] [place]. This information is not intended to replace advice given to you by your health care provider. Make sure you discuss any questions you have with your health care provider. PATIENT INSTRUCTIONS POST-ANESTHESIA  IMMEDIATELY FOLLOWING SURGERY:  Do not drive or operate machinery for the first twenty four hours after surgery.  Do not make any important decisions for twenty four hours after surgery or while taking narcotic pain medications or sedatives.  If you develop intractable nausea and vomiting or a severe headache please notify your doctor immediately.  FOLLOW-UP:  Please make an appointment with your surgeon as instructed. You do not need to follow up with anesthesia unless specifically instructed to do so.  WOUND CARE INSTRUCTIONS (if applicable):  Keep a dry clean dressing on the  anesthesia/puncture wound site if there is drainage.  Once the wound has quit draining you may leave it open to air.  Generally you should leave the bandage intact for twenty four hours unless there is drainage.  If the epidural site drains for more than 36-48 hours please call the anesthesia department.  QUESTIONS?:  Please feel free to call your physician or the hospital operator if you have any questions, and they will be happy to assist you.

## 2014-05-05 NOTE — Pre-Procedure Instructions (Signed)
Patient given information to sign up for my chart at home. 

## 2014-05-05 NOTE — H&P (Signed)
  NTS SOAP Note  Vital Signs:  Vitals as of: 04/23/2014: Systolic 148: Diastolic 67: Heart Rate 83: Temp 100F: Height 495ft 1in: Weight 148Lbs 0 Ounces: Pain Level 7: BMI 27.96  BMI : 27.96 kg/m2  Subjective: This 39 year old female presents for of biliary colic.  Has been having right upper quadrant abdominal pain with radiation of pain to the right flank and back,  nausea,  and fatty food intolerance.  No fever,  chills,  jaudice.  Seen in ER,  ct scan showed cholelithaisis,  lft's wnl.  Review of Symptoms:  Constitutional:unremarkable   Head:unremarkable Eyes:unremarkable   Nose/Mouth/Throat:unremarkable Cardiovascular:  unremarkable Respiratory:unremarkable Gastrointestinabdominal pain, excessive gas Genitourinary:unremarkable   Musculoskeletal:unremarkable Skin:unremarkable Hematolgic/Lymphatic:unremarkable   Allergic/Immunologic:unremarkable   Past Medical History:  Reviewed  Past Medical History  Surgical History: BTL Medical Problems: none Allergies: nkda Medications: none   Social History:Reviewed  Social History  Preferred Language: English Race:  Other Ethnicity: Hispanic / Latino Age: [age] year Marital Status:  M Alcohol: no   Smoking Status: Never smoker reviewed on 04/23/2014 Functional Status reviewed on 04/23/2014 ------------------------------------------------ Bathing: Normal Cooking: Normal Dressing: Normal Driving: Normal Eating: Normal Managing Meds: Normal Oral Care: Normal Shopping: Normal Toileting: Normal Transferring: Normal Walking: Normal Cognitive Status reviewed on 04/23/2014 ------------------------------------------------ Attention: Normal Decision Making: Normal Language: Normal Memory: Normal Motor: Normal Perception: Normal Problem Solving: Normal Visual and Spatial: Normal   Family History:Reviewed  Family Health History Mother, Living; Diabetes mellitus, unspecified type;  Father,  Living; Diabetes mellitus, unspecified type;     Objective Information: General:Well appearing, well nourished in no distress. Heart:RRR, no murmur or gallop.  Normal S1, S2.  No S3, S4.  Lungs:  CTA bilaterally, no wheezes, rhonchi, rales.  Breathing unlabored. Abdomen:Soft, NT/ND, no HSM, no masses.  Assessment:Biliary colic,  cholelithiasis  Diagnoses: 574.20  K80.20 Gallstone (Calculus of gallbladder without cholecystitis without obstruction)  Procedures: [id number] - OFFICE OUTPATIENT NEW 30 MINUTES    Plan:  Patient will call to schedule laparoscopic cholecystectomy.     Patient Education:Alternative treatments to surgery were discussed with patient (and family).  Risks and benefits  of procedure including bleeding,  infection,  hepatobiliary injury,  and the possibility of an open procedure were fully explained to the patient (and family) who gave informed consent. Patient/family questions were addressed.  Follow-up:Pending Surgery

## 2014-05-08 ENCOUNTER — Ambulatory Visit (HOSPITAL_COMMUNITY): Payer: BLUE CROSS/BLUE SHIELD | Admitting: Anesthesiology

## 2014-05-08 ENCOUNTER — Encounter (HOSPITAL_COMMUNITY): Payer: Self-pay | Admitting: *Deleted

## 2014-05-08 ENCOUNTER — Encounter (HOSPITAL_COMMUNITY)
Admission: RE | Disposition: A | Discharge status: Home / Self Care | Payer: Self-pay | Source: Ambulatory Visit | Attending: General Surgery

## 2014-05-08 ENCOUNTER — Ambulatory Visit (HOSPITAL_COMMUNITY)
Admission: RE | Admit: 2014-05-08 | Discharge: 2014-05-08 | Disposition: A | Discharge status: Home / Self Care | Payer: BLUE CROSS/BLUE SHIELD | Source: Ambulatory Visit | Attending: General Surgery | Admitting: General Surgery

## 2014-05-08 DIAGNOSIS — K8010 Calculus of gallbladder with chronic cholecystitis without obstruction: Secondary | ICD-10-CM | POA: Diagnosis not present

## 2014-05-08 HISTORY — PX: CHOLECYSTECTOMY: SHX55

## 2014-05-08 SURGERY — LAPAROSCOPIC CHOLECYSTECTOMY
Anesthesia: General

## 2014-05-08 MED ORDER — FENTANYL CITRATE 0.05 MG/ML IJ SOLN
INTRAMUSCULAR | Status: AC
  Filled 2014-05-08: qty 5

## 2014-05-08 MED ORDER — POVIDONE-IODINE 10 % OINT PACKET
TOPICAL_OINTMENT | CUTANEOUS | Status: DC | PRN
  Administered 2014-05-08: 1 via TOPICAL

## 2014-05-08 MED ORDER — BUPIVACAINE HCL (PF) 0.5 % IJ SOLN
INTRAMUSCULAR | Status: AC
  Filled 2014-05-08: qty 30

## 2014-05-08 MED ORDER — SODIUM CHLORIDE 0.9 % IR SOLN
Status: DC | PRN
  Administered 2014-05-08: 1000 mL

## 2014-05-08 MED ORDER — GLYCOPYRROLATE 0.2 MG/ML IJ SOLN
INTRAMUSCULAR | Status: AC
  Filled 2014-05-08: qty 1

## 2014-05-08 MED ORDER — KETOROLAC TROMETHAMINE 30 MG/ML IJ SOLN
30.0000 mg | Freq: Once | INTRAMUSCULAR | Status: AC
  Administered 2014-05-08: 30 mg via INTRAVENOUS
  Filled 2014-05-08: qty 1

## 2014-05-08 MED ORDER — GLYCOPYRROLATE 0.2 MG/ML IJ SOLN
INTRAMUSCULAR | Status: DC | PRN
  Administered 2014-05-08: 0.6 mg via INTRAVENOUS

## 2014-05-08 MED ORDER — NEOSTIGMINE METHYLSULFATE 10 MG/10ML IV SOLN
INTRAVENOUS | Status: DC | PRN
  Administered 2014-05-08: 4 mg via INTRAVENOUS

## 2014-05-08 MED ORDER — LACTATED RINGERS IV SOLN
INTRAVENOUS | Status: DC
  Administered 2014-05-08: 1000 mL via INTRAVENOUS

## 2014-05-08 MED ORDER — GLYCOPYRROLATE 0.2 MG/ML IJ SOLN
INTRAMUSCULAR | Status: AC
Start: 2014-05-08 — End: 2014-05-08
  Filled 2014-05-08: qty 2

## 2014-05-08 MED ORDER — ONDANSETRON HCL 4 MG/2ML IJ SOLN
INTRAMUSCULAR | Status: DC | PRN
Start: 2014-05-08 — End: 2014-05-08
  Administered 2014-05-08: 4 mg via INTRAVENOUS

## 2014-05-08 MED ORDER — ROCURONIUM BROMIDE 50 MG/5ML IV SOLN
INTRAVENOUS | Status: AC
  Filled 2014-05-08: qty 1

## 2014-05-08 MED ORDER — HEMOSTATIC AGENTS (NO CHARGE) OPTIME
TOPICAL | Status: DC | PRN
  Administered 2014-05-08: 1 via TOPICAL

## 2014-05-08 MED ORDER — MIDAZOLAM HCL 2 MG/2ML IJ SOLN
1.0000 mg | INTRAMUSCULAR | Status: DC | PRN
  Administered 2014-05-08: 2 mg via INTRAVENOUS

## 2014-05-08 MED ORDER — CHLORHEXIDINE GLUCONATE 4 % EX LIQD: 1.0000 "application " | Freq: Once | CUTANEOUS | Status: DC

## 2014-05-08 MED ORDER — BUPIVACAINE HCL (PF) 0.5 % IJ SOLN
INTRAMUSCULAR | Status: DC | PRN
Start: 2014-05-08 — End: 2014-05-08
  Administered 2014-05-08: 10 mL

## 2014-05-08 MED ORDER — FENTANYL CITRATE 0.05 MG/ML IJ SOLN: 25.0000 ug | INTRAMUSCULAR | Status: DC | PRN

## 2014-05-08 MED ORDER — CIPROFLOXACIN IN D5W 400 MG/200ML IV SOLN
INTRAVENOUS | Status: DC | PRN
  Administered 2014-05-08: 400 mg via INTRAVENOUS

## 2014-05-08 MED ORDER — PROPOFOL 10 MG/ML IV BOLUS
INTRAVENOUS | Status: DC | PRN
Start: 2014-05-08 — End: 2014-05-08
  Administered 2014-05-08: 150 mg via INTRAVENOUS

## 2014-05-08 MED ORDER — SUCCINYLCHOLINE CHLORIDE 20 MG/ML IJ SOLN
INTRAMUSCULAR | Status: AC
  Filled 2014-05-08: qty 1

## 2014-05-08 MED ORDER — OXYCODONE-ACETAMINOPHEN 7.5-325 MG PO TABS: 1.0000 | ORAL_TABLET | ORAL | Status: DC | PRN

## 2014-05-08 MED ORDER — ONDANSETRON HCL 4 MG/2ML IJ SOLN
4.0000 mg | Freq: Once | INTRAMUSCULAR | Status: AC
  Administered 2014-05-08: 4 mg via INTRAVENOUS

## 2014-05-08 MED ORDER — ROCURONIUM BROMIDE 100 MG/10ML IV SOLN
INTRAVENOUS | Status: DC | PRN
  Administered 2014-05-08: 30 mg via INTRAVENOUS

## 2014-05-08 MED ORDER — ONDANSETRON HCL 4 MG/2ML IJ SOLN
INTRAMUSCULAR | Status: AC
  Filled 2014-05-08: qty 2

## 2014-05-08 MED ORDER — LACTATED RINGERS IV SOLN
INTRAVENOUS | Status: DC | PRN
  Administered 2014-05-08: 07:00:00 via INTRAVENOUS

## 2014-05-08 MED ORDER — FENTANYL CITRATE 0.05 MG/ML IJ SOLN
INTRAMUSCULAR | Status: DC | PRN
  Administered 2014-05-08: 50 ug via INTRAVENOUS
  Administered 2014-05-08: 100 ug via INTRAVENOUS
  Administered 2014-05-08: 50 ug via INTRAVENOUS
  Administered 2014-05-08: 100 ug via INTRAVENOUS
  Administered 2014-05-08: 50 ug via INTRAVENOUS

## 2014-05-08 MED ORDER — ONDANSETRON HCL 4 MG/2ML IJ SOLN: 4.0000 mg | Freq: Once | INTRAMUSCULAR | Status: DC | PRN

## 2014-05-08 MED ORDER — DEXAMETHASONE SODIUM PHOSPHATE 4 MG/ML IJ SOLN
INTRAMUSCULAR | Status: AC
  Filled 2014-05-08: qty 1

## 2014-05-08 MED ORDER — POVIDONE-IODINE 10 % EX OINT
TOPICAL_OINTMENT | CUTANEOUS | Status: AC
  Filled 2014-05-08: qty 1

## 2014-05-08 MED ORDER — PROPOFOL 10 MG/ML IV BOLUS
INTRAVENOUS | Status: AC
  Filled 2014-05-08: qty 20

## 2014-05-08 MED ORDER — CIPROFLOXACIN IN D5W 400 MG/200ML IV SOLN: 400.0000 mg | Freq: Two times a day (BID) | INTRAVENOUS | Status: DC

## 2014-05-08 MED ORDER — MIDAZOLAM HCL 2 MG/2ML IJ SOLN
INTRAMUSCULAR | Status: AC
  Filled 2014-05-08: qty 2

## 2014-05-08 MED ORDER — FENTANYL CITRATE 0.05 MG/ML IJ SOLN
INTRAMUSCULAR | Status: AC
  Filled 2014-05-08: qty 2

## 2014-05-08 MED ORDER — CIPROFLOXACIN IN D5W 400 MG/200ML IV SOLN
INTRAVENOUS | Status: AC
  Filled 2014-05-08: qty 200

## 2014-05-08 MED ORDER — NEOSTIGMINE METHYLSULFATE 10 MG/10ML IV SOLN
INTRAVENOUS | Status: AC
Start: 2014-05-08 — End: 2014-05-08
  Filled 2014-05-08: qty 1

## 2014-05-08 MED ORDER — DEXAMETHASONE SODIUM PHOSPHATE 4 MG/ML IJ SOLN
INTRAMUSCULAR | Status: DC | PRN
  Administered 2014-05-08: 4 mg via INTRAVENOUS

## 2014-05-08 MED ORDER — LIDOCAINE HCL (PF) 1 % IJ SOLN
INTRAMUSCULAR | Status: DC | PRN
  Administered 2014-05-08: 100 mg

## 2014-05-08 SURGICAL SUPPLY — 41 items
APPLIER CLIP LAPSCP 10X32 DD (CLIP) ×3 IMPLANT
BAG HAMPER (MISCELLANEOUS) ×3 IMPLANT
CHLORAPREP W/TINT 26ML (MISCELLANEOUS) ×3 IMPLANT
CLOTH BEACON ORANGE TIMEOUT ST (SAFETY) ×3 IMPLANT
COVER LIGHT HANDLE STERIS (MISCELLANEOUS) ×6 IMPLANT
DECANTER SPIKE VIAL GLASS SM (MISCELLANEOUS) ×3 IMPLANT
ELECT REM PT RETURN 9FT ADLT (ELECTROSURGICAL) ×3
ELECTRODE REM PT RTRN 9FT ADLT (ELECTROSURGICAL) ×1 IMPLANT
FILTER SMOKE EVAC LAPAROSHD (FILTER) ×3 IMPLANT
FORMALIN 10 PREFIL 120ML (MISCELLANEOUS) ×3 IMPLANT
GLOVE BIOGEL M 7.0 STRL (GLOVE) ×3 IMPLANT
GLOVE BIOGEL PI IND STRL 7.0 (GLOVE) ×3 IMPLANT
GLOVE BIOGEL PI INDICATOR 7.0 (GLOVE) ×6
GLOVE SS BIOGEL STRL SZ 6.5 (GLOVE) ×1 IMPLANT
GLOVE SUPERSENSE BIOGEL SZ 6.5 (GLOVE) ×2
GLOVE SURG SS PI 7.5 STRL IVOR (GLOVE) ×3 IMPLANT
GOWN STRL REUS W/ TWL XL LVL3 (GOWN DISPOSABLE) ×1 IMPLANT
GOWN STRL REUS W/TWL LRG LVL3 (GOWN DISPOSABLE) ×6 IMPLANT
GOWN STRL REUS W/TWL XL LVL3 (GOWN DISPOSABLE) ×2
HEMOSTAT SNOW SURGICEL 2X4 (HEMOSTASIS) ×3 IMPLANT
INST SET LAPROSCOPIC AP (KITS) ×3 IMPLANT
KIT ROOM TURNOVER APOR (KITS) ×3 IMPLANT
MANIFOLD NEPTUNE II (INSTRUMENTS) ×3 IMPLANT
NEEDLE INSUFFLATION 14GA 120MM (NEEDLE) ×3 IMPLANT
NS IRRIG 1000ML POUR BTL (IV SOLUTION) ×3 IMPLANT
PACK LAP CHOLE LZT030E (CUSTOM PROCEDURE TRAY) ×3 IMPLANT
PAD ARMBOARD 7.5X6 YLW CONV (MISCELLANEOUS) ×3 IMPLANT
POUCH SPECIMEN RETRIEVAL 10MM (ENDOMECHANICALS) ×3 IMPLANT
SET BASIN LINEN APH (SET/KITS/TRAYS/PACK) ×3 IMPLANT
SLEEVE ENDOPATH XCEL 5M (ENDOMECHANICALS) ×3 IMPLANT
SPONGE GAUZE 2X2 8PLY STER LF (GAUZE/BANDAGES/DRESSINGS) ×4
SPONGE GAUZE 2X2 8PLY STRL LF (GAUZE/BANDAGES/DRESSINGS) ×8 IMPLANT
STAPLER VISISTAT (STAPLE) ×3 IMPLANT
SUT VICRYL 0 UR6 27IN ABS (SUTURE) ×3 IMPLANT
TAPE CLOTH SURG 4X10 WHT LF (GAUZE/BANDAGES/DRESSINGS) ×3 IMPLANT
TROCAR ENDO BLADELESS 11MM (ENDOMECHANICALS) ×3 IMPLANT
TROCAR XCEL NON-BLD 5MMX100MML (ENDOMECHANICALS) ×3 IMPLANT
TROCAR XCEL UNIV SLVE 11M 100M (ENDOMECHANICALS) ×3 IMPLANT
TUBING INSUFFLATION (TUBING) ×3 IMPLANT
WARMER LAPAROSCOPE (MISCELLANEOUS) ×3 IMPLANT
YANKAUER SUCT 12FT TUBE ARGYLE (SUCTIONS) ×3 IMPLANT

## 2014-05-08 NOTE — Transfer of Care (Signed)
Immediate Anesthesia Transfer of Care Note  Patient: Laura Anderson  Procedure(s) Performed: Procedure(s): LAPAROSCOPIC CHOLECYSTECTOMY (N/A)  Patient Location: PACU  Anesthesia Type:General  Level of Consciousness: awake, alert , oriented, patient cooperative and responds to stimulation  Airway & Oxygen Therapy: Patient Spontanous Breathing and Patient connected to face mask oxygen  Post-op Assessment: Report given to RN, Post -op Vital signs reviewed and stable, Patient moving all extremities and Patient moving all extremities X 4  Post vital signs: Reviewed and stable  Last Vitals:  Filed Vitals:   05/08/14 0720  BP: 116/71  Temp:   Resp: 15    Complications: No apparent anesthesia complications

## 2014-05-08 NOTE — Interval H&P Note (Signed)
History and Physical Interval Note:  05/08/2014 7:16 AM  Laura Anderson  has presented today for surgery, with the diagnosis of cholelithiasis  The various methods of treatment have been discussed with the patient and family. After consideration of risks, benefits and other options for treatment, the patient has consented to  Procedure(s): LAPAROSCOPIC CHOLECYSTECTOMY (N/A) as a surgical intervention .  The patient's history has been reviewed, patient examined, no change in status, stable for surgery.  I have reviewed the patient's chart and labs.  Questions were answered to the patient's satisfaction.     Franky MachoJENKINS,Emmanuelle Coxe A

## 2014-05-08 NOTE — Anesthesia Procedure Notes (Signed)
Procedure Name: Intubation Date/Time: 05/08/2014 7:36 AM Performed by: Patrcia DollyMOSES, Torryn Hudspeth Pre-anesthesia Checklist: Patient identified, Patient being monitored, Timeout performed, Emergency Drugs available and Suction available Patient Re-evaluated:Patient Re-evaluated prior to inductionOxygen Delivery Method: Circle System Utilized Preoxygenation: Pre-oxygenation with 100% oxygen Intubation Type: IV induction Ventilation: Mask ventilation without difficulty Laryngoscope Size: Miller and 2 Grade View: Grade I Tube type: Oral Tube size: 7.0 mm Number of attempts: 1 Airway Equipment and Method: Stylet Placement Confirmation: ETT inserted through vocal cords under direct vision,  positive ETCO2 and breath sounds checked- equal and bilateral Secured at: 21 cm Tube secured with: Tape Dental Injury: Teeth and Oropharynx as per pre-operative assessment

## 2014-05-08 NOTE — Anesthesia Postprocedure Evaluation (Signed)
  Anesthesia Post-op Note  Patient: Engineer, siteCristina Anderson  Procedure(s) Performed: Procedure(s): LAPAROSCOPIC CHOLECYSTECTOMY (N/A)  Patient Location: PACU  Anesthesia Type:General  Level of Consciousness: awake, oriented, patient cooperative and responds to stimulation  Airway and Oxygen Therapy: Patient Spontanous Breathing and Patient connected to face mask oxygen  Post-op Pain: none  Post-op Assessment: Post-op Vital signs reviewed, Patient's Cardiovascular Status Stable, Respiratory Function Stable, Patent Airway, No signs of Nausea or vomiting and Pain level controlled  Post-op Vital Signs: Reviewed and stable  Last Vitals:  Filed Vitals:   05/08/14 0720  BP: 116/71  Temp:   Resp: 15    Complications: No apparent anesthesia complications

## 2014-05-08 NOTE — Op Note (Signed)
Patient:  Laura HawthornCristina Anderson  DOB:  06/18/1975  MRN:  [id number]   Preop Diagnosis:  Cholecystitis, cholelithiasis  Postop Diagnosis:  Same  Procedure:  Laparoscopic cholecystectomy  Surgeon:  Franky MachoMark Rodgerick Gilliand, M.D.  Anes:  Gen. endotracheal  Indications:  Patient is a 39 year old Hispanic female who presents with biliary colic secondary to cholelithiasis. The risks and benefits of the procedure including bleeding, infection, hepatobiliary injury, and the possibility of the procedure were fully explained to the patient, who gave informed consent.  Procedure note:  The patient was placed the supine position. After induction of general endotracheal anesthesia, the abdomen was prepped and draped using usual sterile technique with ChloraPrep. Surgical site confirmation was performed.  A supraumbilical incision was made down to the fascia. A Veress needle was introduced into the abdominal cavity and confirmation of placement was done using the saline drop test. The abdomen was then insufflated to 16 mmHg pressure. An 11 mm trocar was introduced into the abdominal cavity under direct visualization without difficulty. The patient was placed in reverse Trendelenburg position and additional 11 mm trocar was placed the epigastric region 5 mm trochars were placed the right upper quadrant and right flank regions. Liver was inspected and noted to be within normal limits. The gallbladder was retracted in a dynamic fashion in order to expose the triangle of Calot. The cystic duct was first identified. Its junction to the infundibulum was fully identified. Endoclips were placed proximally and distally on the cystic duct, and the cystic duct was divided. This was likewise done to the cystic artery. The gallbladder was freed away from the gallbladder fossa using Bovie electrocautery. The gallbladder was delivered to the epigastric trocar site using an Endo Catch bag. The gallbladder fossa was inspected and no abnormal  bleeding or bile leakage was noted. Surgicel is placed the gallbladder fossa. All fluid and air were then evacuated from the abdominal cavity prior to removal of the trochars.  All wounds were irrigated with normal saline. All wounds were injected with 0.5% Sensorcaine. The supraumbilical fascia as well as epigastric fascia were reapproximated using 0 Vicryl interrupted sutures. All skin incisions were closed using staples. Betadine ointment and dry sterile dressings were applied.  All tape and needle counts were correct the end of the procedure. Patient was extubated in the operating room and transferred to PACU in stable condition.  Complications:  None  EBL:  Minimal  Specimen:  Gallbladder

## 2014-05-08 NOTE — Discharge Instructions (Signed)

## 2014-05-08 NOTE — Anesthesia Preprocedure Evaluation (Signed)
Anesthesia Evaluation  Patient identified by MRN, date of birth, ID band Patient awake    Reviewed: Allergy & Precautions, NPO status , Patient's Chart, lab work & pertinent test results  Airway Mallampati: I  TM Distance: >3 FB     Dental  (+) Teeth Intact, Partial Upper, Partial Lower   Pulmonary neg pulmonary ROS,  breath sounds clear to auscultation        Cardiovascular negative cardio ROS  Rhythm:Regular Rate:Normal     Neuro/Psych    GI/Hepatic negative GI ROS,   Endo/Other    Renal/GU      Musculoskeletal   Abdominal   Peds  Hematology   Anesthesia Other Findings   Reproductive/Obstetrics                             Anesthesia Physical Anesthesia Plan  ASA: I  Anesthesia Plan: General   Post-op Pain Management:    Induction: Intravenous  Airway Management Planned: Oral ETT  Additional Equipment:   Intra-op Plan:   Post-operative Plan: Extubation in OR  Informed Consent: I have reviewed the patients History and Physical, chart, labs and discussed the procedure including the risks, benefits and alternatives for the proposed anesthesia with the patient or authorized representative who has indicated his/her understanding and acceptance.     Plan Discussed with:   Anesthesia Plan Comments:         Anesthesia Quick Evaluation

## 2014-05-11 ENCOUNTER — Encounter (HOSPITAL_COMMUNITY): Payer: Self-pay | Admitting: General Surgery

## 2014-06-23 ENCOUNTER — Encounter: Payer: Self-pay | Admitting: Family

## 2014-06-23 ENCOUNTER — Ambulatory Visit (INDEPENDENT_AMBULATORY_CARE_PROVIDER_SITE_OTHER): Payer: BLUE CROSS/BLUE SHIELD | Admitting: Family

## 2014-06-23 VITALS — BP 109/63 | HR 68 | Temp 98.5°F | Ht 61.0 in | Wt 143.0 lb

## 2014-06-23 DIAGNOSIS — Z23 Encounter for immunization: Secondary | ICD-10-CM

## 2014-06-23 DIAGNOSIS — Z0000 Encounter for general adult medical examination without abnormal findings: Secondary | ICD-10-CM | POA: Diagnosis not present

## 2014-06-23 DIAGNOSIS — Z01419 Encounter for gynecological examination (general) (routine) without abnormal findings: Secondary | ICD-10-CM

## 2014-06-23 NOTE — Patient Instructions (Signed)

## 2014-06-23 NOTE — Progress Notes (Signed)
   Subjective:    Patient ID: Laura Anderson, female    DOB: [birth year], 39 y.o.   MRN: [id number]  Pt presents to the office today for CPE with Pap. Pt currently not taking any medication time. Pt denies any headache, palpitations, SOB, or edema at this time.   Gynecologic Exam Pertinent negatives include no headaches.     Review of Systems  Constitutional: Negative.   HENT: Negative.   Eyes: Negative.   Respiratory: Negative.  Negative for shortness of breath.   Cardiovascular: Negative.  Negative for palpitations.  Gastrointestinal: Negative.   Endocrine: Negative.   Genitourinary: Negative.   Musculoskeletal: Negative.   Neurological: Negative.  Negative for headaches.  Hematological: Negative.   Psychiatric/Behavioral: Negative.   All other systems reviewed and are negative.      Objective:   Physical Exam  Constitutional: She is oriented to person, place, and time. She appears well-developed and well-nourished. No distress.  HENT:  Head: Normocephalic and atraumatic.  Right Ear: External ear normal.  Left Ear: External ear normal.  Nose: Nose normal.  Mouth/Throat: Oropharynx is clear and moist.  Eyes: Pupils are equal, round, and reactive to light.  Neck: Normal range of motion. Neck supple. No thyromegaly present.  Cardiovascular: Normal rate, regular rhythm, normal heart sounds and intact distal pulses.   No murmur heard. Pulmonary/Chest: Effort normal and breath sounds normal. No respiratory distress. She has no wheezes. Right breast exhibits no inverted nipple, no mass, no nipple discharge, no skin change and no tenderness. Left breast exhibits no inverted nipple, no mass, no nipple discharge, no skin change and no tenderness. Breasts are symmetrical.  Abdominal: Soft. Bowel sounds are normal. She exhibits no distension. There is no tenderness.  Genitourinary: Vagina normal.  Bimanual exam- no adnexal masses or tenderness, ovaries nonpalpable   Cervix parous and  pink- No discharge   Musculoskeletal: Normal range of motion. She exhibits no edema or tenderness.  Neurological: She is alert and oriented to person, place, and time. She has normal reflexes. No cranial nerve deficit.  Skin: Skin is warm and dry.  Psychiatric: She has a normal mood and affect. Her behavior is normal. Judgment and thought content normal.  Vitals reviewed.   BP 109/63 mmHg  Pulse 68  Temp(Src) 98.5 F (36.9 C) (Oral)  Ht $R'5\' 1"'pG$  (1.549 m)  Wt 143 lb (64.864 kg)  BMI 27.03 kg/m2  LMP 06/12/2014       Assessment & Plan:  1. Encounter for routine gynecological examination - Pap IG w/ reflex to HPV when ASC-U  2. Annual physical exam - CMP14+EGFR - Lipid panel - Thyroid Panel With TSH - Vit D  25 hydroxy (rtn osteoporosis monitoring) - Pap IG w/ reflex to HPV when ASC-U   Continue all meds Labs pending Health Maintenance reviewed-TDAP given today, pt to schedule mammogram  Diet and exercise encouraged RTO 1 year  Evelina Dun, FNP

## 2014-06-24 LAB — CMP14+EGFR
ALT: 28 IU/L (ref 0–32)
AST: 27 IU/L (ref 0–40)
Albumin/Globulin Ratio: 1.5 (ref 1.1–2.5)
Albumin: 4.4 g/dL (ref 3.5–5.5)
Alkaline Phosphatase: 90 IU/L (ref 39–117)
BILIRUBIN TOTAL: 1.6 mg/dL — AB (ref 0.0–1.2)
BUN/Creatinine Ratio: 12 (ref 9–23)
BUN: 6 mg/dL (ref 6–24)
CALCIUM: 9.4 mg/dL (ref 8.7–10.2)
CO2: 24 mmol/L (ref 18–29)
Chloride: 100 mmol/L (ref 97–108)
Creatinine, Ser: 0.51 mg/dL — ABNORMAL LOW (ref 0.57–1.00)
GFR calc Af Amer: 139 mL/min/{1.73_m2} (ref >59)
GFR calc non Af Amer: 121 mL/min/{1.73_m2} (ref >59)
GLOBULIN, TOTAL: 2.9 g/dL (ref 1.5–4.5)
GLUCOSE: 91 mg/dL (ref 65–99)
Potassium: 4.2 mmol/L (ref 3.5–5.2)
SODIUM: 138 mmol/L (ref 134–144)
TOTAL PROTEIN: 7.3 g/dL (ref 6.0–8.5)

## 2014-06-24 LAB — LIPID PANEL
CHOL/HDL RATIO: 3.1 ratio (ref 0.0–4.4)
Cholesterol, Total: 162 mg/dL (ref 100–199)
HDL: 52 mg/dL (ref >39)
LDL Calculated: 87 mg/dL (ref 0–99)
TRIGLYCERIDES: 116 mg/dL (ref 0–149)
VLDL Cholesterol Cal: 23 mg/dL (ref 5–40)

## 2014-06-24 LAB — THYROID PANEL WITH TSH
Free Thyroxine Index: 2.1 (ref 1.2–4.9)
T3 Uptake Ratio: 28 % (ref 24–39)
T4 TOTAL: 7.5 ug/dL (ref 4.5–12.0)
TSH: 1.3 u[IU]/mL (ref 0.450–4.500)

## 2014-06-24 LAB — VITAMIN D 25 HYDROXY (VIT D DEFICIENCY, FRACTURES): VIT D 25 HYDROXY: 11.8 ng/mL — AB (ref 30.0–100.0)

## 2014-06-25 LAB — PAP IG W/ RFLX HPV ASCU: PAP SMEAR COMMENT: 0

## 2014-06-26 ENCOUNTER — Other Ambulatory Visit: Payer: Self-pay | Admitting: Family

## 2014-06-26 DIAGNOSIS — E559 Vitamin D deficiency, unspecified: Secondary | ICD-10-CM | POA: Insufficient documentation

## 2014-06-26 MED ORDER — VITAMIN D (ERGOCALCIFEROL) 1.25 MG (50000 UNIT) PO CAPS: 50000.0000 [IU] | ORAL_CAPSULE | ORAL | Status: DC

## 2014-06-28 ENCOUNTER — Other Ambulatory Visit: Payer: Self-pay | Admitting: Family

## 2014-08-14 ENCOUNTER — Encounter: Payer: Self-pay | Admitting: Family

## 2014-12-01 ENCOUNTER — Ambulatory Visit: Payer: BLUE CROSS/BLUE SHIELD

## 2015-01-18 ENCOUNTER — Ambulatory Visit (INDEPENDENT_AMBULATORY_CARE_PROVIDER_SITE_OTHER): Payer: BLUE CROSS/BLUE SHIELD | Admitting: *Deleted

## 2015-01-18 DIAGNOSIS — Z111 Encounter for screening for respiratory tuberculosis: Secondary | ICD-10-CM | POA: Diagnosis not present

## 2015-01-18 NOTE — Patient Instructions (Signed)
Tuberculin Skin Test WHY AM I HAVING THIS TEST? Tuberculosis (TB) is a bacterial infection caused by Mycobacterium tuberculosis. Most people who are exposed to these bacteria have a strong enough defense (immune) system to prevent the bacteria from causing TB and developing symptoms. Their bodies prevent the germs from being active and making them sick (latent TB infection).  However, if you have TB germs in your body and your immune system is weak, you can develop a TB infection. This can cause symptoms such as:   Night sweats.  Fever.  Weakness.  Weight loss. A latent TB infection can also become active later in life if your immune system becomes weakened or compromised. You may have this test if your health care provider suspects that you have TB. You may also have this test to screen for TB if you are at risk for getting the disease. Those at increased risk include:  People who inject illegal drugs or share needles.  People with HIV or other diseases that affect immunity.  Health care workers.  People who live in high-risk communities, such as homeless shelters, nursing homes, and correctional facilities.  People who have been in contact with someone with TB.  People from countries where TB is more common. If you are in a high-risk group, your health care provider may wish to screen for TB more often. This can help prevent the spread of the disease. Sometimes TB screening is required when starting a new job, such as becoming a health care worker or a teacher. Colleges or universities may require it of new students. HOW WILL I BE TESTED? A tuberculin skin test is the main test used to check for exposure to the bacteria that can cause TB. The test checks for antibodies to the bacteria. Antibodies are proteins that your body produces to protect you from germs and other things that can make you sick. Your health care provider will inject a solution known as PPD (purified protein  derivative) under the first layer of skin on your arm. This causes a blister-like bubble to form at the site. Your health care provider will then examine the site after a number of hours have passed to see if a reaction has occurred. HOW DO I PREPARE FOR THE TEST? There is no preparation required for this test. WHAT DO THE RESULTS MEAN? Your test results will be reported as either negative or positive.  If the tuberculin skin test produces a negative result, it is likely that you do not have TB and have not been exposed to the TB bacteria. If you or your health care provider suspects exposure, however, you may want to repeat the test a few weeks later. A blood test may also be used to check for TB. This is because you will not react to the tuberculin skin test until several weeks after exposure to TB bacteria. If you test positive to the tuberculin skin test, it is likely that you have been exposed to TB bacteria. The test does not distinguish between an active and a latent TB infection. A false-positive result can occur. A false-positive result for TB bacteria is incorrect because it indicates a condition or finding is present when it is not. Talk to your health care provider to discuss your results, treatment options, and if necessary, the need for more tests. It is your responsibility to obtain your test results. Ask the lab or department performing the test when and how you will get your results. Talk with your   health care provider if you have any questions about your results.   This information is not intended to replace advice given to you by your health care provider. Make sure you discuss any questions you have with your health care provider.   Document Released: 11/23/2004 Document Revised: 03/06/2014 Document Reviewed: 06/09/2013 Elsevier Interactive Patient Education 2016 [employer]  

## 2015-01-18 NOTE — Progress Notes (Signed)
PPD placed on left forearm and patient tolerated well. Patient will return on Wednesday after 3pm to have her tb skin test placed.

## 2015-01-20 LAB — TB SKIN TEST
Induration: 0 mm
TB Skin Test: NEGATIVE

## 2015-04-10 NOTE — Progress Notes (Signed)
REVIEWED-NO ADDITIONAL RECOMMENDATIONS. 

## 2015-06-30 ENCOUNTER — Encounter: Payer: Self-pay | Admitting: *Deleted

## 2015-06-30 ENCOUNTER — Encounter (INDEPENDENT_AMBULATORY_CARE_PROVIDER_SITE_OTHER): Payer: Self-pay

## 2015-07-07 ENCOUNTER — Ambulatory Visit (INDEPENDENT_AMBULATORY_CARE_PROVIDER_SITE_OTHER): Payer: BLUE CROSS/BLUE SHIELD | Admitting: Family

## 2015-07-07 ENCOUNTER — Encounter: Payer: Self-pay | Admitting: Family

## 2015-07-07 VITALS — BP 97/56 | HR 84 | Temp 97.7°F | Ht 61.0 in | Wt 158.0 lb

## 2015-07-07 DIAGNOSIS — Z0000 Encounter for general adult medical examination without abnormal findings: Secondary | ICD-10-CM | POA: Diagnosis not present

## 2015-07-07 DIAGNOSIS — Z01419 Encounter for gynecological examination (general) (routine) without abnormal findings: Secondary | ICD-10-CM

## 2015-07-07 DIAGNOSIS — E559 Vitamin D deficiency, unspecified: Secondary | ICD-10-CM

## 2015-07-07 LAB — MICROSCOPIC EXAMINATION
BACTERIA UA: NONE SEEN
RBC, UA: NONE SEEN /hpf (ref 0–?)

## 2015-07-07 LAB — URINALYSIS, COMPLETE
Bilirubin, UA: NEGATIVE
Glucose, UA: NEGATIVE
Ketones, UA: NEGATIVE
Leukocytes, UA: NEGATIVE
Nitrite, UA: NEGATIVE
PH UA: 6 (ref 5.0–7.5)
PROTEIN UA: NEGATIVE
RBC, UA: NEGATIVE
Specific Gravity, UA: 1.02 (ref 1.005–1.030)
UUROB: 0.2 mg/dL (ref 0.2–1.0)

## 2015-07-07 NOTE — Patient Instructions (Signed)
Health Maintenance, Female Adopting a healthy lifestyle and getting preventive care can go a long way to promote health and wellness. Talk with your health care provider about what schedule of regular examinations is right for you. This is a good chance for you to check in with your provider about disease prevention and staying healthy. In between checkups, there are plenty of things you can do on your own. Experts have done a lot of research about which lifestyle changes and preventive measures are most likely to keep you healthy. Ask your health care provider for more information. WEIGHT AND DIET  Eat a healthy diet  Be sure to include plenty of vegetables, fruits, low-fat dairy products, and lean protein.  Do not eat a lot of foods high in solid fats, added sugars, or salt.  Get regular exercise. This is one of the most important things you can do for your health.  Most adults should exercise for at least 150 minutes each week. The exercise should increase your heart rate and make you sweat (moderate-intensity exercise).  Most adults should also do strengthening exercises at least twice a week. This is in addition to the moderate-intensity exercise.  Maintain a healthy weight  Body mass index (BMI) is a measurement that can be used to identify possible weight problems. It estimates body fat based on height and weight. Your health care provider can help determine your BMI and help you achieve or maintain a healthy weight.  For females 20 years of age and older:   A BMI below 18.5 is considered underweight.  A BMI of 18.5 to 24.9 is normal.  A BMI of 25 to 29.9 is considered overweight.  A BMI of 30 and above is considered obese.  Watch levels of cholesterol and blood lipids  You should start having your blood tested for lipids and cholesterol at 42 years of age, then have this test every 5 years.  You may need to have your cholesterol levels checked more often if:  Your lipid  or cholesterol levels are high.  You are older than 42 years of age.  You are at high risk for heart disease.  CANCER SCREENING   Lung Cancer  Lung cancer screening is recommended for adults 55-80 years old who are at high risk for lung cancer because of a history of smoking.  A yearly low-dose CT scan of the lungs is recommended for people who:  Currently smoke.  Have quit within the past 15 years.  Have at least a 30-pack-year history of smoking. A pack year is smoking an average of one pack of cigarettes a day for 1 year.  Yearly screening should continue until it has been 15 years since you quit.  Yearly screening should stop if you develop a health problem that would prevent you from having lung cancer treatment.  Breast Cancer  Practice breast self-awareness. This means understanding how your breasts normally appear and feel.  It also means doing regular breast self-exams. Let your health care provider know about any changes, no matter how small.  If you are in your 20s or 30s, you should have a clinical breast exam (CBE) by a health care provider every 1-3 years as part of a regular health exam.  If you are 40 or older, have a CBE every year. Also consider having a breast X-ray (mammogram) every year.  If you have a family history of breast cancer, talk to your health care provider about genetic screening.  If you   are at high risk for breast cancer, talk to your health care provider about having an MRI and a mammogram every year.  Breast cancer gene (BRCA) assessment is recommended for women who have family members with BRCA-related cancers. BRCA-related cancers include:  Breast.  Ovarian.  Tubal.  Peritoneal cancers.  Results of the assessment will determine the need for genetic counseling and BRCA1 and BRCA2 testing. Cervical Cancer Your health care provider may recommend that you be screened regularly for cancer of the pelvic organs (ovaries, uterus, and  vagina). This screening involves a pelvic examination, including checking for microscopic changes to the surface of your cervix (Pap test). You may be encouraged to have this screening done every 3 years, beginning at age 21.  For women ages 30-65, health care providers may recommend pelvic exams and Pap testing every 3 years, or they may recommend the Pap and pelvic exam, combined with testing for human papilloma virus (HPV), every 5 years. Some types of HPV increase your risk of cervical cancer. Testing for HPV may also be done on women of any age with unclear Pap test results.  Other health care providers may not recommend any screening for nonpregnant women who are considered low risk for pelvic cancer and who do not have symptoms. Ask your health care provider if a screening pelvic exam is right for you.  If you have had past treatment for cervical cancer or a condition that could lead to cancer, you need Pap tests and screening for cancer for at least 20 years after your treatment. If Pap tests have been discontinued, your risk factors (such as having a new sexual partner) need to be reassessed to determine if screening should resume. Some women have medical problems that increase the chance of getting cervical cancer. In these cases, your health care provider may recommend more frequent screening and Pap tests. Colorectal Cancer  This type of cancer can be detected and often prevented.  Routine colorectal cancer screening usually begins at 42 years of age and continues through 42 years of age.  Your health care provider may recommend screening at an earlier age if you have risk factors for colon cancer.  Your health care provider may also recommend using home test kits to check for hidden blood in the stool.  A small camera at the end of a tube can be used to examine your colon directly (sigmoidoscopy or colonoscopy). This is done to check for the earliest forms of colorectal  cancer.  Routine screening usually begins at age 50.  Direct examination of the colon should be repeated every 5-10 years through 42 years of age. However, you may need to be screened more often if early forms of precancerous polyps or small growths are found. Skin Cancer  Check your skin from head to toe regularly.  Tell your health care provider about any new moles or changes in moles, especially if there is a change in a mole's shape or color.  Also tell your health care provider if you have a mole that is larger than the size of a pencil eraser.  Always use sunscreen. Apply sunscreen liberally and repeatedly throughout the day.  Protect yourself by wearing long sleeves, pants, a wide-brimmed hat, and sunglasses whenever you are outside. HEART DISEASE, DIABETES, AND HIGH BLOOD PRESSURE   High blood pressure causes heart disease and increases the risk of stroke. High blood pressure is more likely to develop in:  People who have blood pressure in the high end   of the normal range (130-139/85-89 mm Hg).  People who are overweight or obese.  People who are African American.  If you are 38-23 years of age, have your blood pressure checked every 3-5 years. If you are 61 years of age or older, have your blood pressure checked every year. You should have your blood pressure measured twice--once when you are at a hospital or clinic, and once when you are not at a hospital or clinic. Record the average of the two measurements. To check your blood pressure when you are not at a hospital or clinic, you can use:  An automated blood pressure machine at a pharmacy.  A home blood pressure monitor.  If you are between 45 years and 39 years old, ask your health care provider if you should take aspirin to prevent strokes.  Have regular diabetes screenings. This involves taking a blood sample to check your fasting blood sugar level.  If you are at a normal weight and have a low risk for diabetes,  have this test once every three years after 42 years of age.  If you are overweight and have a high risk for diabetes, consider being tested at a younger age or more often. PREVENTING INFECTION  Hepatitis B  If you have a higher risk for hepatitis B, you should be screened for this virus. You are considered at high risk for hepatitis B if:  You were born in a country where hepatitis B is common. Ask your health care provider which countries are considered high risk.  Your parents were born in a high-risk country, and you have not been immunized against hepatitis B (hepatitis B vaccine).  You have HIV or AIDS.  You use needles to inject street drugs.  You live with someone who has hepatitis B.  You have had sex with someone who has hepatitis B.  You get hemodialysis treatment.  You take certain medicines for conditions, including cancer, organ transplantation, and autoimmune conditions. Hepatitis C  Blood testing is recommended for:  Everyone born from [birth year] through 1967.  Anyone with known risk factors for hepatitis C. Sexually transmitted infections (STIs)  You should be screened for sexually transmitted infections (STIs) including gonorrhea and chlamydia if:  You are sexually active and are younger than 42 years of age.  You are older than 42 years of age and your health care provider tells you that you are at risk for this type of infection.  Your sexual activity has changed since you were last screened and you are at an increased risk for chlamydia or gonorrhea. Ask your health care provider if you are at risk.  If you do not have HIV, but are at risk, it may be recommended that you take a prescription medicine daily to prevent HIV infection. This is called pre-exposure prophylaxis (PrEP). You are considered at risk if:  You are sexually active and do not regularly use condoms or know the HIV status of your partner(s).  You take drugs by injection.  You are sexually  active with a partner who has HIV. Talk with your health care provider about whether you are at high risk of being infected with HIV. If you choose to begin PrEP, you should first be tested for HIV. You should then be tested every 3 months for as long as you are taking PrEP.  PREGNANCY   If you are premenopausal and you may become pregnant, ask your health care provider about preconception counseling.  If you may  become pregnant, take 400 to 800 micrograms (mcg) of folic acid every day.  If you want to prevent pregnancy, talk to your health care provider about birth control (contraception). OSTEOPOROSIS AND MENOPAUSE   Osteoporosis is a disease in which the bones lose minerals and strength with aging. This can result in serious bone fractures. Your risk for osteoporosis can be identified using a bone density scan.  If you are 61 years of age or older, or if you are at risk for osteoporosis and fractures, ask your health care provider if you should be screened.  Ask your health care provider whether you should take a calcium or vitamin D supplement to lower your risk for osteoporosis.  Menopause may have certain physical symptoms and risks.  Hormone replacement therapy may reduce some of these symptoms and risks. Talk to your health care provider about whether hormone replacement therapy is right for you.  HOME CARE INSTRUCTIONS   Schedule regular health, dental, and eye exams.  Stay current with your immunizations.   Do not use any tobacco products including cigarettes, chewing tobacco, or electronic cigarettes.  If you are pregnant, do not drink alcohol.  If you are breastfeeding, limit how much and how often you drink alcohol.  Limit alcohol intake to no more than 1 drink per day for nonpregnant women. One drink equals 12 ounces of beer, 5 ounces of wine, or 1 ounces of hard liquor.  Do not use street drugs.  Do not share needles.  Ask your health care provider for help if  you need support or information about quitting drugs.  Tell your health care provider if you often feel depressed.  Tell your health care provider if you have ever been abused or do not feel safe at home.   This information is not intended to replace advice given to you by your health care provider. Make sure you discuss any questions you have with your health care provider.   Document Released: 08/29/2010 Document Revised: 03/06/2014 Document Reviewed: 01/15/2013 Elsevier Interactive Patient Education [employer].

## 2015-07-07 NOTE — Progress Notes (Signed)
   Subjective:    Patient ID: Laura Anderson, female    DOB: Jul 19, 1975, 40 y.o.   MRN: [id number]  HPI Pt presents to the office today for CPE with pap. PT currently not taking any schedule medications. Pt denies any headache, palpitations, SOB, or edema at this time. Pt reports doing well at this time.     Review of Systems  Constitutional: Negative.   HENT: Negative.   Eyes: Negative.   Respiratory: Negative.  Negative for shortness of breath.   Cardiovascular: Negative.  Negative for palpitations.  Gastrointestinal: Negative.   Endocrine: Negative.   Genitourinary: Negative.   Musculoskeletal: Negative.   Neurological: Negative.  Negative for headaches.  Hematological: Negative.   Psychiatric/Behavioral: Negative.   All other systems reviewed and are negative.      Objective:   Physical Exam  Constitutional: She is oriented to person, place, and time. She appears well-developed and well-nourished. No distress.  HENT:  Head: Normocephalic and atraumatic.  Right Ear: External ear normal.  Left Ear: External ear normal.  Nose: Nose normal.  Mouth/Throat: Oropharynx is clear and moist.  Eyes: Pupils are equal, round, and reactive to light.  Neck: Normal range of motion. Neck supple. No thyromegaly present.  Cardiovascular: Normal rate, regular rhythm, normal heart sounds and intact distal pulses.   No murmur heard. Pulmonary/Chest: Effort normal and breath sounds normal. No respiratory distress. She has no wheezes. Right breast exhibits no inverted nipple, no mass, no nipple discharge, no skin change and no tenderness. Left breast exhibits no inverted nipple, no mass, no nipple discharge, no skin change and no tenderness. Breasts are symmetrical.  Abdominal: Soft. Bowel sounds are normal. She exhibits no distension. There is no tenderness.  Genitourinary: Vagina normal.  Bimanual exam- no adnexal masses or tenderness, ovaries nonpalpable   Cervix parous and pink- No  discharge   Musculoskeletal: Normal range of motion. She exhibits no edema or tenderness.  Neurological: She is alert and oriented to person, place, and time. She has normal reflexes. No cranial nerve deficit.  Skin: Skin is warm and dry.  Psychiatric: She has a normal mood and affect. Her behavior is normal. Judgment and thought content normal.  Vitals reviewed.     BP 97/56 mmHg  Pulse 84  Temp(Src) 97.7 F (36.5 C) (Oral)  Ht _0  (1.549 m)  Wt 158 lb (71.668 kg)  BMI 29.87 kg/m2     Assessment & Plan:  1. Encounter for routine gynecological examination - Urinalysis, Complete - CMP14+EGFR - Pap IG w/ reflex to HPV when ASC-U  2. Vitamin D deficiency - CMP14+EGFR - VITAMIN D 25 Hydroxy (Vit-D Deficiency, Fractures)  3. Annual physical exam - CMP14+EGFR - Anemia Profile B - Lipid panel - Thyroid Panel With TSH - VITAMIN D 25 Hydroxy (Vit-D Deficiency, Fractures) - Pap IG w/ reflex to HPV when ASC-U   Continue all meds Labs pending Health Maintenance reviewed Diet and exercise encouraged RTO 1 year  Evelina Dun, FNP

## 2015-07-08 LAB — ANEMIA PROFILE B
BASOS: 0 %
Basophils Absolute: 0 10*3/uL (ref 0.0–0.2)
EOS (ABSOLUTE): 0.4 10*3/uL (ref 0.0–0.4)
EOS: 5 %
FERRITIN: 40 ng/mL (ref 15–150)
Folate: 13.7 ng/mL (ref >3.0)
HEMOGLOBIN: 13.1 g/dL (ref 11.1–15.9)
Hematocrit: 38.8 % (ref 34.0–46.6)
IMMATURE GRANS (ABS): 0.1 10*3/uL (ref 0.0–0.1)
IRON SATURATION: 18 % (ref 15–55)
Immature Granulocytes: 1 %
Iron: 55 ug/dL (ref 27–159)
Lymphocytes Absolute: 1.8 10*3/uL (ref 0.7–3.1)
Lymphs: 25 %
MCH: 29 pg (ref 26.6–33.0)
MCHC: 33.8 g/dL (ref 31.5–35.7)
MCV: 86 fL (ref 79–97)
MONOS ABS: 0.7 10*3/uL (ref 0.1–0.9)
Monocytes: 10 %
NEUTROS ABS: 4.4 10*3/uL (ref 1.4–7.0)
Neutrophils: 59 %
Platelets: 285 10*3/uL (ref 150–379)
RBC: 4.51 x10E6/uL (ref 3.77–5.28)
RDW: 13.6 % (ref 12.3–15.4)
Retic Ct Pct: 1.9 % (ref 0.6–2.6)
TIBC: 307 ug/dL (ref 250–450)
UIBC: 252 ug/dL (ref 131–425)
VITAMIN B 12: 440 pg/mL (ref 211–946)
WBC: 7.4 10*3/uL (ref 3.4–10.8)

## 2015-07-08 LAB — CMP14+EGFR
ALK PHOS: 76 IU/L (ref 39–117)
ALT: 42 IU/L — ABNORMAL HIGH (ref 0–32)
AST: 34 IU/L (ref 0–40)
Albumin/Globulin Ratio: 1.2 (ref 1.2–2.2)
Albumin: 4.3 g/dL (ref 3.5–5.5)
BUN/Creatinine Ratio: 23 (ref 9–23)
BUN: 10 mg/dL (ref 6–24)
Bilirubin Total: 1.1 mg/dL (ref 0.0–1.2)
CO2: 21 mmol/L (ref 18–29)
CREATININE: 0.44 mg/dL — AB (ref 0.57–1.00)
Calcium: 9.5 mg/dL (ref 8.7–10.2)
Chloride: 99 mmol/L (ref 96–106)
GFR calc Af Amer: 145 mL/min/{1.73_m2} (ref >59)
GFR calc non Af Amer: 126 mL/min/{1.73_m2} (ref >59)
GLUCOSE: 81 mg/dL (ref 65–99)
Globulin, Total: 3.5 g/dL (ref 1.5–4.5)
Potassium: 4.6 mmol/L (ref 3.5–5.2)
Sodium: 137 mmol/L (ref 134–144)
Total Protein: 7.8 g/dL (ref 6.0–8.5)

## 2015-07-08 LAB — LIPID PANEL
CHOL/HDL RATIO: 3.2 ratio (ref 0.0–4.4)
Cholesterol, Total: 165 mg/dL (ref 100–199)
HDL: 52 mg/dL (ref >39)
LDL Calculated: 90 mg/dL (ref 0–99)
Triglycerides: 114 mg/dL (ref 0–149)
VLDL Cholesterol Cal: 23 mg/dL (ref 5–40)

## 2015-07-08 LAB — THYROID PANEL WITH TSH
Free Thyroxine Index: 1.7 (ref 1.2–4.9)
T3 Uptake Ratio: 25 % (ref 24–39)
T4 TOTAL: 6.9 ug/dL (ref 4.5–12.0)
TSH: 1.94 u[IU]/mL (ref 0.450–4.500)

## 2015-07-08 LAB — VITAMIN D 25 HYDROXY (VIT D DEFICIENCY, FRACTURES): VIT D 25 HYDROXY: 17 ng/mL — AB (ref 30.0–100.0)

## 2015-07-08 NOTE — Progress Notes (Signed)
Patient aware. Will get otc vitamin d

## 2015-07-10 LAB — PAP IG W/ RFLX HPV ASCU: PAP Smear Comment: 0

## 2015-10-20 ENCOUNTER — Encounter: Payer: BLUE CROSS/BLUE SHIELD | Admitting: *Deleted

## 2015-10-26 ENCOUNTER — Telehealth: Payer: Self-pay | Admitting: Family

## 2015-11-22 ENCOUNTER — Encounter: Payer: BLUE CROSS/BLUE SHIELD | Admitting: *Deleted

## 2015-12-03 ENCOUNTER — Encounter: Payer: Self-pay | Admitting: Family

## 2015-12-03 ENCOUNTER — Ambulatory Visit (INDEPENDENT_AMBULATORY_CARE_PROVIDER_SITE_OTHER): Payer: BLUE CROSS/BLUE SHIELD | Admitting: Family

## 2015-12-03 VITALS — BP 111/74 | HR 95 | Temp 97.5°F | Ht 61.0 in | Wt 155.6 lb

## 2015-12-03 DIAGNOSIS — N3001 Acute cystitis with hematuria: Secondary | ICD-10-CM | POA: Diagnosis not present

## 2015-12-03 DIAGNOSIS — R3989 Other symptoms and signs involving the genitourinary system: Secondary | ICD-10-CM

## 2015-12-03 LAB — MICROSCOPIC EXAMINATION

## 2015-12-03 LAB — URINALYSIS, COMPLETE
BILIRUBIN UA: NEGATIVE
GLUCOSE, UA: NEGATIVE
Ketones, UA: NEGATIVE
Nitrite, UA: NEGATIVE
PROTEIN UA: NEGATIVE
Specific Gravity, UA: 1.015 (ref 1.005–1.030)
Urobilinogen, Ur: 1 mg/dL (ref 0.2–1.0)
pH, UA: 7.5 (ref 5.0–7.5)

## 2015-12-03 MED ORDER — SULFAMETHOXAZOLE-TRIMETHOPRIM 800-160 MG PO TABS: 1.0000 | ORAL_TABLET | Freq: Two times a day (BID) | ORAL | 0 refills | Status: DC

## 2015-12-03 NOTE — Progress Notes (Signed)
   Subjective:    Patient ID: Laura HawthornCristina Humphres, female    DOB: 07/20/1975, 40 y.o.   MRN: [id number]  Dysuria   This is a new problem. The current episode started in the past 7 days. The problem occurs every urination. The problem has been gradually worsening. The quality of the pain is described as burning. The pain is at a severity of 5/10. The pain is mild. There has been no fever. Associated symptoms include urgency. Pertinent negatives include no flank pain, frequency, hematuria, hesitancy, nausea or vomiting. She has tried nothing for the symptoms. The treatment provided no relief.      Review of Systems  Gastrointestinal: Negative for nausea and vomiting.  Genitourinary: Positive for dysuria and urgency. Negative for flank pain, frequency, hematuria and hesitancy.  All other systems reviewed and are negative.      Objective:   Physical Exam  Constitutional: She appears well-developed and well-nourished.  Cardiovascular: Normal rate, regular rhythm, normal heart sounds and intact distal pulses.   Pulmonary/Chest: Effort normal and breath sounds normal.  Abdominal: Soft. Bowel sounds are normal. She exhibits no distension. There is no tenderness.  Musculoskeletal: Normal range of motion.  Negative CVA tenderness  Skin: Skin is warm and dry.  Psychiatric: She has a normal mood and affect. Her behavior is normal. Judgment and thought content normal.      BP 111/74   Pulse 95   Temp 97.5 F (36.4 C) (Oral)   Ht 5\' 1"  (1.549 m)   Wt 155 lb 9.6 oz (70.6 kg)   BMI 29.40 kg/m      Assessment & Plan:  1. Urine troubles - Urinalysis, Complete  2. Acute cystitis with hematuria -Force fluids AZO over the counter X2 days RTO prn Culture pending - sulfamethoxazole-trimethoprim (BACTRIM DS) 800-160 MG tablet; Take 1 tablet by mouth 2 (two) times daily.  Dispense: 14 tablet; Refill: 0 - Urine culture  Jannifer Rodneyhristy Davisha Linthicum, FNP

## 2015-12-03 NOTE — Addendum Note (Signed)
Addended by: Jannifer RodneyHAWKS, Stanely Sexson A on: 12/03/2015 04:25 PM   Modules accepted: Orders

## 2015-12-03 NOTE — Patient Instructions (Signed)

## 2015-12-08 LAB — URINE CULTURE

## 2016-03-22 ENCOUNTER — Encounter: Payer: Self-pay | Admitting: Pediatrics

## 2016-03-22 ENCOUNTER — Ambulatory Visit (INDEPENDENT_AMBULATORY_CARE_PROVIDER_SITE_OTHER): Payer: BLUE CROSS/BLUE SHIELD | Admitting: Pediatrics

## 2016-03-22 VITALS — BP 107/68 | HR 85 | Temp 98.0°F | Ht 61.0 in | Wt 153.0 lb

## 2016-03-22 DIAGNOSIS — R399 Unspecified symptoms and signs involving the genitourinary system: Secondary | ICD-10-CM | POA: Diagnosis not present

## 2016-03-22 DIAGNOSIS — R309 Painful micturition, unspecified: Secondary | ICD-10-CM

## 2016-03-22 LAB — MICROSCOPIC EXAMINATION
BACTERIA UA: NONE SEEN
Epithelial Cells (non renal): NONE SEEN /hpf (ref 0–10)
RBC MICROSCOPIC, UA: NONE SEEN /HPF (ref 0–?)
Renal Epithel, UA: NONE SEEN /hpf

## 2016-03-22 LAB — URINALYSIS, COMPLETE
Bilirubin, UA: NEGATIVE
GLUCOSE, UA: NEGATIVE
Ketones, UA: NEGATIVE
Leukocytes, UA: NEGATIVE
Nitrite, UA: NEGATIVE
Protein, UA: NEGATIVE
RBC, UA: NEGATIVE
Specific Gravity, UA: <1.005 — ABNORMAL LOW (ref 1.005–1.030)
UUROB: 0.2 mg/dL (ref 0.2–1.0)
pH, UA: 7 (ref 5.0–7.5)

## 2016-03-22 NOTE — Progress Notes (Signed)
  Subjective:   Patient ID: Laura Anderson, female    DOB: 10/25/1975, 41 y.o.   MRN: [id number] CC: painful with urination and Vaginal pressure  HPI: Laura Anderson is a 41 y.o. female presenting for painful with urination and Vaginal pressure  Feels different than prior UTI Ongoing past two days Not all the time Has dysuria No pain most of the time No abd pain, no vaginal discharge Not worried about STIs Now not as bad as last UTI a couple of months ago Last intercourse 2 days ago   Relevant past medical, surgical, family and social history reviewed. Allergies and medications reviewed and updated. History  Smoking Status  . Never Smoker  Smokeless Tobacco  . Never Used    Comment: NEVER SMOKED   ROS: Per HPI   Objective:    BP 107/68   Pulse 85   Temp 98 F (36.7 C) (Oral)   Ht 5\' 1"  (1.549 m)   Wt 153 lb (69.4 kg)   BMI 28.91 kg/m   Wt Readings from Last 3 Encounters:  03/22/16 153 lb (69.4 kg)  12/03/15 155 lb 9.6 oz (70.6 kg)  07/07/15 158 lb (71.7 kg)    Gen: NAD, alert, cooperative with exam, NCAT EYES: EOMI, no conjunctival injection, or no icterus CV: NRRR, normal S1/S2, no murmur, distal pulses 2+ b/l Resp: CTABL, no wheezes, normal WOB Abd: +BS, soft, NTND. no guarding or organomegaly, no CVA tenderness Ext: No edema, warm Neuro: Alert and oriented MSK: normal muscle bulk  Assessment & Plan:  Laura Anderson was seen today for painful with urination and vaginal pressure.  Diagnoses and all orders for this visit:  UTI symptoms UA completely normal Will send for culture Started after last intercourse Can try AZO Not concerned for STI but if not improving will need pelvic exam and testing Let me know if not improving -     Urinalysis, Complete -     Urine culture  Painful urination -     Urinalysis, Complete   Follow up plan: prn Rex Krasarol Vincent, MD [facility]

## 2016-03-24 LAB — URINE CULTURE

## 2016-06-07 ENCOUNTER — Ambulatory Visit (INDEPENDENT_AMBULATORY_CARE_PROVIDER_SITE_OTHER): Payer: BLUE CROSS/BLUE SHIELD | Admitting: Family

## 2016-06-07 ENCOUNTER — Encounter: Payer: Self-pay | Admitting: Family

## 2016-06-07 VITALS — BP 114/76 | HR 113 | Temp 97.6°F | Ht 61.0 in | Wt 155.8 lb

## 2016-06-07 DIAGNOSIS — R109 Unspecified abdominal pain: Secondary | ICD-10-CM | POA: Diagnosis not present

## 2016-06-07 DIAGNOSIS — N3000 Acute cystitis without hematuria: Secondary | ICD-10-CM

## 2016-06-07 LAB — URINALYSIS, COMPLETE
Bilirubin, UA: NEGATIVE
Glucose, UA: NEGATIVE
Ketones, UA: NEGATIVE
Leukocytes, UA: NEGATIVE
Nitrite, UA: NEGATIVE
PH UA: 7 (ref 5.0–7.5)
RBC UA: NEGATIVE
Specific Gravity, UA: 1.015 (ref 1.005–1.030)
UUROB: 4 mg/dL — AB (ref 0.2–1.0)

## 2016-06-07 LAB — MICROSCOPIC EXAMINATION
RBC MICROSCOPIC, UA: NONE SEEN /HPF (ref 0–?)
Renal Epithel, UA: NONE SEEN /hpf

## 2016-06-07 MED ORDER — NITROFURANTOIN MONOHYD MACRO 100 MG PO CAPS: 100.0000 mg | ORAL_CAPSULE | Freq: Two times a day (BID) | ORAL | 0 refills | Status: DC

## 2016-06-07 NOTE — Progress Notes (Signed)
   Subjective:    Patient ID: Laura Anderson, female    DOB: 04-08-1975, 41 y.o.   MRN: [id number]  Flank Pain  This is a new problem. The current episode started in the past 7 days. The problem occurs intermittently. The problem has been waxing and waning since onset. The pain is at a severity of 8/10. The pain is mild. Pertinent negatives include no dysuria or tingling. She has tried NSAIDs for the symptoms. The treatment provided moderate relief.      Review of Systems  Genitourinary: Positive for flank pain. Negative for dysuria.  Neurological: Negative for tingling.  All other systems reviewed and are negative.      Objective:   Physical Exam  Constitutional: She is oriented to person, place, and time. She appears well-developed and well-nourished. No distress.  HENT:  Head: Normocephalic.  Eyes: Pupils are equal, round, and reactive to light.  Neck: Normal range of motion. Neck supple. No thyromegaly present.  Cardiovascular: Normal rate, regular rhythm, normal heart sounds and intact distal pulses.   No murmur heard. Pulmonary/Chest: Effort normal and breath sounds normal. No respiratory distress. She has no wheezes.  Abdominal: Soft. Bowel sounds are normal. She exhibits no distension. There is no tenderness.  Musculoskeletal: Normal range of motion. She exhibits no edema or tenderness.  Negative CVA tenderness  Neurological: She is alert and oriented to person, place, and time.  Skin: Skin is warm and dry.  Psychiatric: She has a normal mood and affect. Her behavior is normal. Judgment and thought content normal.  Vitals reviewed.     BP 114/76   Pulse (!) 113   Temp 97.6 F (36.4 C) (Oral)   Ht  (1.549 m)   Wt 155 lb 12.8 oz (70.7 kg)   BMI 29.44 kg/m      Assessment & Plan:  1. Left flank pain - Urinalysis, Complete  2. Acute cystitis without hematuria Force fluids AZO over the counter X2 days RTO prn Culture pending - Urine culture -  nitrofurantoin, macrocrystal-monohydrate, (MACROBID) 100 MG capsule; Take 1 capsule (100 mg total) by mouth 2 (two) times daily.  Dispense: 10 capsule; Refill: 0   Jannifer Rodney, FNP

## 2016-06-07 NOTE — Patient Instructions (Addendum)

## 2016-06-09 LAB — URINE CULTURE

## 2016-11-10 ENCOUNTER — Other Ambulatory Visit: Payer: BLUE CROSS/BLUE SHIELD | Admitting: Family

## 2016-11-20 ENCOUNTER — Ambulatory Visit (INDEPENDENT_AMBULATORY_CARE_PROVIDER_SITE_OTHER): Payer: BLUE CROSS/BLUE SHIELD | Admitting: Family

## 2016-11-20 ENCOUNTER — Encounter: Payer: Self-pay | Admitting: Family

## 2016-11-20 VITALS — BP 107/69 | HR 82 | Temp 98.7°F | Ht 61.0 in | Wt 157.2 lb

## 2016-11-20 DIAGNOSIS — Z0000 Encounter for general adult medical examination without abnormal findings: Secondary | ICD-10-CM | POA: Diagnosis not present

## 2016-11-20 DIAGNOSIS — Z01419 Encounter for gynecological examination (general) (routine) without abnormal findings: Secondary | ICD-10-CM | POA: Diagnosis not present

## 2016-11-20 NOTE — Patient Instructions (Signed)

## 2016-11-20 NOTE — Progress Notes (Signed)
   Subjective:    Patient ID: Laura Anderson, female    DOB: Jun 25, 1975, 41 y.o.   MRN: [id number]  HPI Pt presents to the office today for CPE with pap. PT currently not taking any prescribed medications. Pt denies any headache, palpitations, SOB, or edema at this time.     Review of Systems  All other systems reviewed and are negative.      Objective:   Physical Exam  Constitutional: She is oriented to person, place, and time. She appears well-developed and well-nourished. No distress.  HENT:  Head: Normocephalic and atraumatic.  Right Ear: External ear normal.  Left Ear: External ear normal.  Nose: Nose normal.  Mouth/Throat: Oropharynx is clear and moist.  Eyes: Pupils are equal, round, and reactive to light.  Neck: Normal range of motion. Neck supple. No thyromegaly present.  Cardiovascular: Normal rate, regular rhythm, normal heart sounds and intact distal pulses.   No murmur heard. Pulmonary/Chest: Effort normal and breath sounds normal. No respiratory distress. She has no wheezes. Right breast exhibits no inverted nipple, no mass, no nipple discharge, no skin change and no tenderness. Left breast exhibits no inverted nipple, no mass, no nipple discharge, no skin change and no tenderness. Breasts are symmetrical.  Abdominal: Soft. Bowel sounds are normal. She exhibits no distension. There is no tenderness.  Genitourinary: Vagina normal.  Genitourinary Comments: Bimanual exam- no adnexal masses or tenderness, ovaries nonpalpable   Cervix parous and pink- No discharge   Musculoskeletal: Normal range of motion. She exhibits no edema or tenderness.  Neurological: She is alert and oriented to person, place, and time. She has normal reflexes. No cranial nerve deficit.  Skin: Skin is warm and dry.  Psychiatric: She has a normal mood and affect. Her behavior is normal. Judgment and thought content normal.  Vitals reviewed.    BP 107/69   Pulse 82   Temp 98.7 F (37.1 C)  (Oral)   Ht '5\' 1"'$  (1.549 m)   Wt 157 lb 3.2 oz (71.3 kg)   BMI 29.70 kg/m       Assessment & Plan:  1. Annual physical exam - CBC with Differential/Platelet - CMP14+EGFR - Lipid panel - TSH - VITAMIN D 25 Hydroxy (Vit-D Deficiency, Fractures) - Pap IG w/ reflex to HPV when ASC-U  2. Encounter for gynecological examination without abnormal finding - CBC with Differential/Platelet - CMP14+EGFR - Pap IG w/ reflex to HPV when ASC-U   Continue all meds Labs pending Health Maintenance reviewed Diet and exercise encouraged RTO 1 year   Evelina Dun, FNP

## 2016-11-21 LAB — CBC WITH DIFFERENTIAL/PLATELET
BASOS ABS: 0 10*3/uL (ref 0.0–0.2)
Basos: 0 %
EOS (ABSOLUTE): 0.4 10*3/uL (ref 0.0–0.4)
Eos: 6 %
Hematocrit: 38.8 % (ref 34.0–46.6)
Hemoglobin: 13.1 g/dL (ref 11.1–15.9)
IMMATURE GRANS (ABS): 0 10*3/uL (ref 0.0–0.1)
Immature Granulocytes: 0 %
LYMPHS: 28 %
Lymphocytes Absolute: 1.7 10*3/uL (ref 0.7–3.1)
MCH: 28.9 pg (ref 26.6–33.0)
MCHC: 33.8 g/dL (ref 31.5–35.7)
MCV: 86 fL (ref 79–97)
MONOS ABS: 0.5 10*3/uL (ref 0.1–0.9)
Monocytes: 8 %
NEUTROS ABS: 3.6 10*3/uL (ref 1.4–7.0)
NEUTROS PCT: 58 %
Platelets: 271 10*3/uL (ref 150–379)
RBC: 4.54 x10E6/uL (ref 3.77–5.28)
RDW: 13.5 % (ref 12.3–15.4)
WBC: 6.3 10*3/uL (ref 3.4–10.8)

## 2016-11-21 LAB — CMP14+EGFR
A/G RATIO: 1.2 (ref 1.2–2.2)
ALBUMIN: 4.3 g/dL (ref 3.5–5.5)
ALK PHOS: 84 IU/L (ref 39–117)
ALT: 39 IU/L — ABNORMAL HIGH (ref 0–32)
AST: 32 IU/L (ref 0–40)
BILIRUBIN TOTAL: 1 mg/dL (ref 0.0–1.2)
BUN / CREAT RATIO: 11 (ref 9–23)
BUN: 6 mg/dL (ref 6–24)
CHLORIDE: 100 mmol/L (ref 96–106)
CO2: 24 mmol/L (ref 20–29)
Calcium: 9.9 mg/dL (ref 8.7–10.2)
Creatinine, Ser: 0.56 mg/dL — ABNORMAL LOW (ref 0.57–1.00)
GFR calc Af Amer: 132 mL/min/{1.73_m2} (ref >59)
GFR calc non Af Amer: 115 mL/min/{1.73_m2} (ref >59)
GLOBULIN, TOTAL: 3.7 g/dL (ref 1.5–4.5)
Glucose: 133 mg/dL — ABNORMAL HIGH (ref 65–99)
POTASSIUM: 4.3 mmol/L (ref 3.5–5.2)
SODIUM: 139 mmol/L (ref 134–144)
Total Protein: 8 g/dL (ref 6.0–8.5)

## 2016-11-21 LAB — LIPID PANEL
CHOLESTEROL TOTAL: 167 mg/dL (ref 100–199)
Chol/HDL Ratio: 3.4 ratio (ref 0.0–4.4)
HDL: 49 mg/dL (ref >39)
LDL CALC: 84 mg/dL (ref 0–99)
Triglycerides: 171 mg/dL — ABNORMAL HIGH (ref 0–149)
VLDL Cholesterol Cal: 34 mg/dL (ref 5–40)

## 2016-11-21 LAB — TSH: TSH: 2.05 u[IU]/mL (ref 0.450–4.500)

## 2016-11-21 LAB — VITAMIN D 25 HYDROXY (VIT D DEFICIENCY, FRACTURES): VIT D 25 HYDROXY: 16.1 ng/mL — AB (ref 30.0–100.0)

## 2016-11-23 ENCOUNTER — Other Ambulatory Visit: Payer: Self-pay | Admitting: Family

## 2016-11-23 MED ORDER — VITAMIN D (ERGOCALCIFEROL) 1.25 MG (50000 UNIT) PO CAPS: 50000.0000 [IU] | ORAL_CAPSULE | ORAL | 3 refills | Status: DC

## 2016-11-24 LAB — PAP IG W/ RFLX HPV ASCU: PAP Smear Comment: 0

## 2016-11-24 LAB — HPV DNA PROBE HIGH RISK, AMPLIFIED: HPV, high-risk: NEGATIVE

## 2017-03-05 LAB — HM MAMMOGRAPHY

## 2017-04-12 ENCOUNTER — Encounter: Payer: Self-pay | Admitting: Family

## 2017-04-12 ENCOUNTER — Ambulatory Visit: Payer: BLUE CROSS/BLUE SHIELD | Admitting: Family

## 2017-04-12 VITALS — BP 101/71 | HR 95 | Temp 97.4°F | Ht 61.0 in | Wt 159.0 lb

## 2017-04-12 DIAGNOSIS — R109 Unspecified abdominal pain: Secondary | ICD-10-CM | POA: Diagnosis not present

## 2017-04-12 DIAGNOSIS — R399 Unspecified symptoms and signs involving the genitourinary system: Secondary | ICD-10-CM | POA: Diagnosis not present

## 2017-04-12 LAB — URINALYSIS, COMPLETE
Bilirubin, UA: NEGATIVE
Glucose, UA: NEGATIVE
Leukocytes, UA: NEGATIVE
Nitrite, UA: NEGATIVE
Protein, UA: NEGATIVE
RBC, UA: NEGATIVE
Specific Gravity, UA: 1.015 (ref 1.005–1.030)
Urobilinogen, Ur: 1 mg/dL (ref 0.2–1.0)
pH, UA: 7.5 (ref 5.0–7.5)

## 2017-04-12 LAB — MICROSCOPIC EXAMINATION
RBC, UA: NONE SEEN /hpf (ref 0–?)
Renal Epithel, UA: NONE SEEN /hpf

## 2017-04-12 NOTE — Progress Notes (Signed)
   Subjective:    Patient ID: Laura Anderson, female    DOB: 05/20/1975, 42 y.o.   MRN: [id number]  Dysuria   This is a new problem. The current episode started in the past 7 days. The problem occurs intermittently. The problem has been waxing and waning. The quality of the pain is described as burning. The pain is at a severity of 8/10. The pain is mild. Associated symptoms include frequency, hesitancy and urgency. Pertinent negatives include no flank pain, hematuria or nausea. She has tried increased fluids for the symptoms. The treatment provided mild relief.      Review of Systems  Gastrointestinal: Negative for nausea.  Genitourinary: Positive for dysuria, frequency, hesitancy and urgency. Negative for flank pain and hematuria.  All other systems reviewed and are negative.      Objective:   Physical Exam  Constitutional: She is oriented to person, place, and time. She appears well-developed and well-nourished. No distress.  HENT:  Head: Normocephalic and atraumatic.  Right Ear: External ear normal.  Left Ear: External ear normal.  Nose: Nose normal.  Mouth/Throat: Oropharynx is clear and moist.  Eyes: Pupils are equal, round, and reactive to light.  Neck: Normal range of motion. Neck supple. No thyromegaly present.  Cardiovascular: Normal rate, regular rhythm, normal heart sounds and intact distal pulses.  No murmur heard. Pulmonary/Chest: Effort normal and breath sounds normal. No respiratory distress. She has no wheezes.  Abdominal: Soft. Bowel sounds are normal. She exhibits no distension. There is no tenderness.  Musculoskeletal: Normal range of motion. She exhibits no edema or tenderness.  Neurological: She is alert and oriented to person, place, and time.  Skin: Skin is warm and dry.  Psychiatric: She has a normal mood and affect. Her behavior is normal. Judgment and thought content normal.  Vitals reviewed.   BP 101/71   Pulse 95   Temp (!) 97.4 F (36.3 C) (Oral)    Ht 5\' 1"  (1.549 m)   Wt 159 lb (72.1 kg)   BMI 30.04 kg/m      Assessment & Plan:  1. Abdominal pressure - Urinalysis, Complete - Urine Culture  2. UTI symptoms Force fluids AZO over the counter X2 days RTO prn Culture pending - Urine Culture    Jannifer Rodneyhristy Hawks, FNP

## 2017-04-12 NOTE — Patient Instructions (Signed)

## 2017-04-13 LAB — URINE CULTURE

## 2017-10-19 ENCOUNTER — Ambulatory Visit: Payer: BLUE CROSS/BLUE SHIELD | Admitting: Family

## 2017-10-23 ENCOUNTER — Ambulatory Visit: Payer: BLUE CROSS/BLUE SHIELD | Admitting: Family

## 2017-11-12 ENCOUNTER — Ambulatory Visit: Payer: Self-pay | Admitting: Family

## 2017-11-22 ENCOUNTER — Encounter: Payer: Self-pay | Admitting: Family

## 2018-10-07 ENCOUNTER — Emergency Department (HOSPITAL_COMMUNITY)
Admission: EM | Admit: 2018-10-07 | Discharge: 2018-10-07 | Disposition: A | Discharge status: Home / Self Care | Payer: Self-pay | Attending: Emergency Medicine | Admitting: Emergency Medicine

## 2018-10-07 ENCOUNTER — Other Ambulatory Visit: Payer: Self-pay

## 2018-10-07 ENCOUNTER — Emergency Department (HOSPITAL_COMMUNITY): Payer: Self-pay

## 2018-10-07 ENCOUNTER — Encounter (HOSPITAL_COMMUNITY): Payer: Self-pay | Admitting: *Deleted

## 2018-10-07 DIAGNOSIS — M25511 Pain in right shoulder: Secondary | ICD-10-CM | POA: Insufficient documentation

## 2018-10-07 MED ORDER — LIDOCAINE 5 % EX PTCH: 1.0000 | MEDICATED_PATCH | CUTANEOUS | 0 refills | Status: AC

## 2018-10-07 MED ORDER — HYDROCODONE-ACETAMINOPHEN 5-325 MG PO TABS
1.0000 | ORAL_TABLET | Freq: Once | ORAL | Status: AC
  Administered 2018-10-07: 12:00:00 1 via ORAL
  Filled 2018-10-07: qty 1

## 2018-10-07 MED ORDER — PREDNISONE 20 MG PO TABS
40.0000 mg | ORAL_TABLET | Freq: Once | ORAL | Status: AC
  Administered 2018-10-07: 12:00:00 40 mg via ORAL
  Filled 2018-10-07: qty 2

## 2018-10-07 MED ORDER — PREDNISONE 20 MG PO TABS: 40.0000 mg | ORAL_TABLET | Freq: Every day | ORAL | 0 refills | Status: AC

## 2018-10-07 MED ORDER — HYDROCODONE-ACETAMINOPHEN 5-325 MG PO TABS: 1.0000 | ORAL_TABLET | Freq: Four times a day (QID) | ORAL | 0 refills | Status: AC | PRN

## 2018-10-07 NOTE — Discharge Instructions (Signed)
Take the medications as prescribed. Do not drive or operate heavy machinery while taking this medication. Follow up with orthopedics if pain not improving within the next 3 days.   Return for new or worsening pain.

## 2018-10-07 NOTE — ED Triage Notes (Signed)
Pt woke up with right shoulder pain Friday morning and eased up and pain has returned. Pt denies any hx of pain to area or injury.

## 2018-10-07 NOTE — ED Provider Notes (Signed)
[facility] EMERGENCY DEPARTMENT Provider Note   CSN: [id number] Arrival date & time: 10/07/18  1051   History   Chief Complaint Chief Complaint  Patient presents with   Shoulder Pain   HPI Laura Anderson is a 43 y.o. female with past medical history significant for chronic abd pain, elevated LFTs who presents for evaluation of shoulder pain. Pain began on Friday, 4 days PTA.  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  Patient denies any recent overhead movement however has been doing a lot of pushing and pulling with the school system since she began back.  Pain began gradually.  Pain located to anterior right shoulder.  Pain worse with overhead movement.  Denies fever, chills, nausea, vomiting, neck pain, chest pain, shortness of breath, numbness or tingling, redness, swelling, warmth to her extremities.  Denies recent injuries or trauma.  Took ibuprofen this morning for her pain.  Has been using OTC lidocaine patches for her pain.  She rates her current pain a 9/10.  Described as aching and sharp with movements.  No history of gout or septic joint.  No IV drug use.  Denies additional rating alleviating factors.  History obtained from patient and family in room.  No interpreter was used.     HPI  Past Medical History:  Diagnosis Date   Cholelithiasis    Chronic abdominal pain    Elevated LFTs     Patient Active Problem List   Diagnosis Date Noted   Vitamin D deficiency 06/26/2014   Elevated LFTs 10/24/2013    Past Surgical History:  Procedure Laterality Date   CHOLECYSTECTOMY N/A 05/08/2014   Procedure: LAPAROSCOPIC CHOLECYSTECTOMY;  Surgeon: Franky MachoMark Jenkins Md, MD;  Location: [facility];  Service: General;  Laterality: N/A;   TUBAL LIGATION       OB History   No obstetric history on file.      Home Medications    Prior to Admission medications   Medication Sig Start Date End Date Taking? Authorizing Provider  HYDROcodone-acetaminophen  (NORCO/VICODIN) 5-325 MG tablet Take 1 tablet by mouth every 6 (six) hours as needed for severe pain. 10/07/18   Jamie Hafford A, PA-C  lidocaine (LIDODERM) 5 % Place 1 patch onto the skin daily. Remove & Discard patch within 12 hours or as directed by MD 10/07/18   Mylissa Lambe A, PA-C  predniSONE (DELTASONE) 20 MG tablet Take 2 tablets (40 mg total) by mouth daily for 5 days. 10/07/18 10/12/18  Aundrea Higginbotham A, PA-C    Family History Family History  Problem Relation Age of Onset   Diabetes Mother    Diabetes Father    Colon cancer Neg Hx    Liver disease Neg Hx     Social History Social History   Tobacco Use   Smoking status: Never Smoker   Smokeless tobacco: Never Used   Tobacco comment: NEVER SMOKED  Substance Use Topics   Alcohol use: Yes    Comment: socially   Drug use: No     Allergies   Patient has no known allergies.   Review of Systems Review of Systems  Constitutional: Negative.   HENT: Negative.   Respiratory: Negative.   Cardiovascular: Negative.   Gastrointestinal: Negative.   Genitourinary: Negative.   Musculoskeletal: Negative for arthralgias, back pain, gait problem, joint swelling, myalgias, neck pain and neck stiffness.       Right shoulder pain  Skin: Negative.   Neurological: Negative.   All other systems  reviewed and are negative.   Physical Exam Updated Vital Signs BP (!) 143/104 (BP Location: Left Arm)    Pulse 100    Temp 98.1 F (36.7 C) (Oral)    Resp 18    Ht 5\' 1"  (1.549 m)    Wt 68 kg    LMP 10/05/2018    SpO2 100%    BMI 28.34 kg/m   Physical Exam Vitals signs and nursing note reviewed.  Constitutional:      General: She is not in acute distress.    Appearance: She is well-developed. She is not ill-appearing, toxic-appearing or diaphoretic.  HENT:     Head: Atraumatic.     Nose: Nose normal.     Mouth/Throat:     Mouth: Mucous membranes are moist.     Pharynx: Oropharynx is clear.  Eyes:     Pupils: Pupils  are equal, round, and reactive to light.  Neck:     Musculoskeletal: Normal range of motion.     Comments: No neck stiffness or neck rigidity.  Full range of motion without difficulty. Cardiovascular:     Rate and Rhythm: Normal rate.     Pulses: Normal pulses.          Radial pulses are 2+ on the right side and 2+ on the left side.     Heart sounds: Normal heart sounds.  Pulmonary:     Effort: Pulmonary effort is normal. No respiratory distress.     Breath sounds: Normal breath sounds.  Chest:     Comments: No chest wall tenderness to palpation.  No crepitus or deformity. Abdominal:     General: Bowel sounds are normal. There is no distension.  Musculoskeletal: Normal range of motion.     Left shoulder: Normal.     Right elbow: Normal.    Cervical back: Normal.     Right upper arm: Normal.     Left upper arm: Normal.     Right forearm: Normal.       Arms:     Comments: Full range of motion left upper extremity.  Tenderness to right anterior shoulder over rotator cuff.  Positive Hawkins, empty can however patient with general pain with movement especially with overhead greater than 90 degrees.  No obvious deformity.  No bony tenderness.  No swelling.  2+ radial pulses bilaterally.  Compartments soft.  Full range of motion to elbow and wrist.  No tenderness over clavicle or scaphoid.  Skin:    General: Skin is warm and dry.     Comments: No edema, erythema, ecchymosis or warmth.  No rashes or lesions.  Brisk capillary refill.  No fluctuance, induration.  Neurological:     General: No focal deficit present.     Mental Status: She is alert.     Cranial Nerves: Cranial nerves are intact.     Sensory: Sensation is intact.     Motor: No abnormal muscle tone or pronator drift.     Gait: Gait is intact.     Deep Tendon Reflexes: Reflexes are normal and symmetric.     Reflex Scores:      Bicep reflexes are 2+ on the right side and 2+ on the left side.      Brachioradialis reflexes are  2+ on the right side and 2+ on the left side.    Comments: Decreased strength with flexion, extension, abduction and abduction to right shoulder secondary to pain.  Equal reflexes bilaterally.  Sensation intact to sharp and  dull.  Ambulatory gait without limp.  No facial droop.  Cranial nerves II through XII grossly intact.    ED Treatments / Results  Labs (all labs ordered are listed, but only abnormal results are displayed) Labs Reviewed - No data to display  EKG None  Radiology Dg Shoulder Right  Result Date: 10/07/2018 CLINICAL DATA:  Shoulder pain upon wakening.  No reported injury. EXAM: RIGHT SHOULDER - 2+ VIEW COMPARISON:  None. FINDINGS: Calcification superior and lateral to the humeral head more consistent with chronic calcific tendinopathy than avulsion fracture. Glenohumeral joint is normal. No other regional bone or soft tissue finding. IMPRESSION: Probable calcific tendinopathy of the rotator cuff.  See above. Electronically Signed   By: Paulina FusiMark  Shogry M.D.   On: 10/07/2018 11:37    Procedures Procedures (including critical care time)  Medications Ordered in ED Medications  HYDROcodone-acetaminophen (NORCO/VICODIN) 5-325 MG per tablet 1 tablet (1 tablet Oral Given 10/07/18 1226)  predniSONE (DELTASONE) tablet 40 mg (40 mg Oral Given 10/07/18 1226)   Initial Impression / Assessment and Plan / ED Course  I have reviewed the triage vital signs and the nursing notes.  Pertinent labs & imaging results that were available during my care of the patient were reviewed by me and considered in my medical decision making (see chart for details).  43 year old female appears otherwise well presents for evaluation of shoulder pain x4 days.  She is afebrile, nonseptic, non-ill-appearing.  Patient does admit to pushing and pulling motions at work which she just went back approximately 1 week ago after being off for the summer.  She has tenderness palpation to her right anterior shoulder.   Difficulty with overhead motion.  Positive Hawkins, empty can.  No recent injury or trauma.  No overlying skin changes.  She is neurovascularly intact.  Plain film x-ray obtained from triage which showed calcification at her rotator cuff however no fracture, dislocation or effusion.  Compartments are soft.  Exam consistent with bursitis.  Offered injection however patient declined.  She can take limited NSAIDs secondary to GI upset.  Will do short course of steroids, pain medicine and lidocaine patches.  Patient to follow with orthopedics if symptoms do not resolve.  I have low suspicion for septic joint, gout, hemarthrosis, septic bursitis, DVT, compartment syndrome, fracture, dislocation, referred pain from neck or chest, infectious process.  Hemodynamically stable.  Did have mildly elevated blood pressure in department however likely due to pain. No indication of hypertensive urgency or emergency.  She is without chest pain, headache, unilateral weakness, nausea, vomiting, abdominal pain.  Patient to follow-up with PCP for reevaluation her blood pressure.  The patient has been appropriately medically screened and/or stabilized in the ED. I have low suspicion for any other emergent medical condition which would require further screening, evaluation or treatment in the ED or require inpatient management.      Final Clinical Impressions(s) / ED Diagnoses   Final diagnoses:  Acute pain of right shoulder    ED Discharge Orders         Ordered    HYDROcodone-acetaminophen (NORCO/VICODIN) 5-325 MG tablet  Every 6 hours PRN     10/07/18 1235    predniSONE (DELTASONE) 20 MG tablet  Daily     10/07/18 1235    lidocaine (LIDODERM) 5 %  Every 24 hours     10/07/18 1235           Basil Buffin A, PA-C 10/07/18 1236    Samuel JesterMcManus, Kathleen, DO 10/10/18  1537 ° °

## 2019-03-24 ENCOUNTER — Other Ambulatory Visit (HOSPITAL_COMMUNITY): Payer: Self-pay | Admitting: Sports Medicine

## 2019-03-24 DIAGNOSIS — Z1231 Encounter for screening mammogram for malignant neoplasm of breast: Secondary | ICD-10-CM

## 2019-03-28 ENCOUNTER — Inpatient Hospital Stay (HOSPITAL_COMMUNITY): Admission: RE | Admit: 2019-03-28 | Payer: Self-pay | Source: Ambulatory Visit

## 2019-04-04 ENCOUNTER — Ambulatory Visit (HOSPITAL_COMMUNITY): Payer: Self-pay

## 2019-04-04 ENCOUNTER — Ambulatory Visit (HOSPITAL_COMMUNITY)
Admission: RE | Admit: 2019-04-04 | Discharge: 2019-04-04 | Disposition: A | Discharge status: Home / Self Care | Payer: Self-pay | Source: Ambulatory Visit | Attending: Sports Medicine | Admitting: Sports Medicine

## 2019-04-04 ENCOUNTER — Other Ambulatory Visit: Payer: Self-pay

## 2019-04-04 DIAGNOSIS — Z1231 Encounter for screening mammogram for malignant neoplasm of breast: Secondary | ICD-10-CM | POA: Insufficient documentation

## 2019-04-21 ENCOUNTER — Other Ambulatory Visit (HOSPITAL_COMMUNITY): Payer: Self-pay | Admitting: Sports Medicine

## 2019-04-21 ENCOUNTER — Inpatient Hospital Stay
Admission: RE | Admit: 2019-04-21 | Discharge: 2019-04-21 | Disposition: A | Discharge status: Home / Self Care | Payer: Self-pay | Source: Ambulatory Visit | Attending: Sports Medicine | Admitting: Sports Medicine

## 2019-04-21 DIAGNOSIS — Z1231 Encounter for screening mammogram for malignant neoplasm of breast: Secondary | ICD-10-CM

## 2019-06-28 ENCOUNTER — Ambulatory Visit: Payer: Self-pay | Attending: Internal Medicine

## 2019-06-28 DIAGNOSIS — Z23 Encounter for immunization: Secondary | ICD-10-CM

## 2019-06-28 NOTE — Progress Notes (Signed)
   Covid-19 Vaccination Clinic  Name:  Genesee Nase    MRN: [id number] DOB: 1976/01/08  06/28/2019  Ms. Hemmerich was observed post Covid-19 immunization for 15 minutes   without incident. She was provided with Vaccine Information Sheet and instruction to access the V-Safe system.   Ms. Huxford was instructed to call 911 with any severe reactions post vaccine: Marland Kitchen Difficulty breathing  . Swelling of face and throat  . A fast heartbeat  . A bad rash all over body  . Dizziness and weakness   Immunizations Administered    Name Date Dose VIS Date Route   Moderna COVID-19 Vaccine 06/28/2019 10:11 AM 0.5 mL 01/2019 Intramuscular   Manufacturer: Moderna   Lot: [id number]   NDC: 70786-754-49

## 2019-08-02 ENCOUNTER — Ambulatory Visit: Payer: Self-pay | Attending: Internal Medicine

## 2019-08-02 DIAGNOSIS — Z23 Encounter for immunization: Secondary | ICD-10-CM

## 2019-08-02 NOTE — Progress Notes (Signed)
   Covid-19 Vaccination Clinic  Name:  Rocquel Askren    MRN: [id number] DOB: 03/26/1975  08/02/2019  Ms. Ricketson was observed post Covid-19 immunization for 15 minutes without incident. She was provided with Vaccine Information Sheet and instruction to access the V-Safe system.   Ms. Spurrier was instructed to call 911 with any severe reactions post vaccine: Marland Kitchen Difficulty breathing  . Swelling of face and throat  . A fast heartbeat  . A bad rash all over body  . Dizziness and weakness   Immunizations Administered    Name Date Dose VIS Date Route   Moderna COVID-19 Vaccine 08/02/2019 10:02 AM 0.5 mL 01/2019 Intramuscular   Manufacturer: Moderna   Lot: [id number]   NDC: 91916-606-00

## 2020-09-20 ENCOUNTER — Other Ambulatory Visit (HOSPITAL_COMMUNITY): Payer: Self-pay | Admitting: Family Medicine

## 2020-09-20 DIAGNOSIS — Z1231 Encounter for screening mammogram for malignant neoplasm of breast: Secondary | ICD-10-CM

## 2020-09-29 ENCOUNTER — Ambulatory Visit (HOSPITAL_COMMUNITY): Payer: Self-pay

## 2020-10-04 ENCOUNTER — Ambulatory Visit (HOSPITAL_COMMUNITY)
Admission: RE | Admit: 2020-10-04 | Discharge: 2020-10-04 | Disposition: A | Discharge status: Home / Self Care | Payer: 59 | Source: Ambulatory Visit | Attending: Family Medicine | Admitting: Family Medicine

## 2020-10-04 ENCOUNTER — Other Ambulatory Visit: Payer: Self-pay

## 2020-10-04 DIAGNOSIS — Z1231 Encounter for screening mammogram for malignant neoplasm of breast: Secondary | ICD-10-CM | POA: Insufficient documentation

## 2021-08-26 ENCOUNTER — Emergency Department (HOSPITAL_COMMUNITY)
Admission: EM | Admit: 2021-08-26 | Discharge: 2021-08-26 | Disposition: A | Discharge status: Home / Self Care | Payer: 59 | Attending: Emergency Medicine | Admitting: Emergency Medicine

## 2021-08-26 ENCOUNTER — Emergency Department (HOSPITAL_COMMUNITY): Payer: 59

## 2021-08-26 ENCOUNTER — Encounter (HOSPITAL_COMMUNITY): Payer: Self-pay

## 2021-08-26 ENCOUNTER — Other Ambulatory Visit: Payer: Self-pay

## 2021-08-26 DIAGNOSIS — M25511 Pain in right shoulder: Secondary | ICD-10-CM | POA: Insufficient documentation

## 2021-08-26 MED ORDER — IBUPROFEN 800 MG PO TABS: 800.0000 mg | ORAL_TABLET | Freq: Three times a day (TID) | ORAL | 0 refills | Status: AC

## 2021-08-26 MED ORDER — OXYCODONE-ACETAMINOPHEN 5-325 MG PO TABS: 1.0000 | ORAL_TABLET | Freq: Four times a day (QID) | ORAL | 0 refills | Status: AC | PRN

## 2021-08-26 NOTE — ED Provider Notes (Signed)
[facility] EMERGENCY DEPARTMENT Provider Note   CSN: [id number] Arrival date & time: 08/26/21  0160     History  Chief Complaint  Patient presents with   Shoulder Pain    Laura Anderson is a 46 y.o. female.  Patient has a history of gallstones.  She is complaining of right shoulder pain.  The history is provided by the patient and medical records. A language interpreter was used.  Shoulder Pain Location:  Shoulder Shoulder location:  R shoulder Injury: yes   Pain details:    Quality:  Aching   Severity:  Moderate   Onset quality:  Sudden   Timing:  Constant Handedness:  Right-handed Associated symptoms: no back pain and no fatigue        Home Medications Prior to Admission medications   Medication Sig Start Date End Date Taking? Authorizing Provider  ibuprofen (ADVIL) 800 MG tablet Take 1 tablet (800 mg total) by mouth 3 (three) times daily. 08/26/21  Yes Bethann Berkshire, MD  oxyCODONE-acetaminophen (PERCOCET/ROXICET) 5-325 MG tablet Take 1 tablet by mouth every 6 (six) hours as needed for severe pain. 08/26/21  Yes Bethann Berkshire, MD  HYDROcodone-acetaminophen (NORCO/VICODIN) 5-325 MG tablet Take 1 tablet by mouth every 6 (six) hours as needed for severe pain. Patient not taking: Reported on 08/26/2021 10/07/18   Henderly, Britni A, PA-C  lidocaine (LIDODERM) 5 % Place 1 patch onto the skin daily. Remove & Discard patch within 12 hours or as directed by MD Patient not taking: Reported on 08/26/2021 10/07/18   Henderly, Britni A, PA-C      Allergies    Patient has no known allergies.    Review of Systems   Review of Systems  Constitutional:  Negative for appetite change and fatigue.  HENT:  Negative for congestion, ear discharge and sinus pressure.   Eyes:  Negative for discharge.  Respiratory:  Negative for cough.   Cardiovascular:  Negative for chest pain.  Gastrointestinal:  Negative for abdominal pain and diarrhea.  Genitourinary:  Negative for frequency and  hematuria.  Musculoskeletal:  Negative for back pain.       Right shoulder pain  Skin:  Negative for rash.  Neurological:  Negative for seizures and headaches.  Psychiatric/Behavioral:  Negative for hallucinations.     Physical Exam Updated Vital Signs BP 122/78 (BP Location: Left Arm)   Pulse 93   Temp 98.7 F (37.1 C) (Oral)   Resp 18   Ht 5\' 1"  (1.549 m)   Wt 65.8 kg   LMP 08/19/2021   SpO2 98%   BMI 27.40 kg/m  Physical Exam Vitals and nursing note reviewed.  Constitutional:      Appearance: She is well-developed.  HENT:     Head: Normocephalic.     Nose: Nose normal.  Eyes:     General: No scleral icterus.    Conjunctiva/sclera: Conjunctivae normal.  Neck:     Thyroid: No thyromegaly.  Cardiovascular:     Rate and Rhythm: Normal rate and regular rhythm.     Heart sounds: No murmur heard.    No friction rub. No gallop.  Pulmonary:     Breath sounds: No stridor. No wheezing or rales.  Chest:     Chest wall: No tenderness.  Abdominal:     General: There is no distension.     Tenderness: There is no abdominal tenderness. There is no rebound.  Musculoskeletal:     Cervical back: Neck supple.     Comments: Tenderness right  shoulder with decreased range of motion  Lymphadenopathy:     Cervical: No cervical adenopathy.  Skin:    Findings: No erythema or rash.  Neurological:     Mental Status: She is oriented to person, place, and time.     Motor: No abnormal muscle tone.     Coordination: Coordination normal.  Psychiatric:        Behavior: Behavior normal.     ED Results / Procedures / Treatments   Labs (all labs ordered are listed, but only abnormal results are displayed) Labs Reviewed - No data to display  EKG None  Radiology DG Shoulder Right  Result Date: 08/26/2021 CLINICAL DATA:  Shoulder pain since Wednesday EXAM: RIGHT SHOULDER - 2+ VIEW COMPARISON:  Shoulder radiographs 10/07/2018 FINDINGS: Calcific fragments are again seen along the  greater tuberosity likely reflecting calcific tendinopathy. There is no evidence of acute fracture or dislocation. Alignment is normal. The joint spaces are preserved. IMPRESSION: Calcifications along the greater tuberosity again likely reflecting calcific tendinopathy. No evidence of acute injury. Electronically Signed   By: Lesia Hausen M.D.   On: 08/26/2021 09:03    Procedures Procedures    Medications Ordered in ED Medications - No data to display  ED Course/ Medical Decision Making/ A&P                           Medical Decision Making Amount and/or Complexity of Data Reviewed Radiology: ordered.  Risk Prescription drug management.  This patient presents to the ED for concern of shoulder pain, this involves an extensive number of treatment options, and is a complaint that carries with it a high risk of complications and morbidity.  The differential diagnosis includes fractured shoulder, dislocation,   Co morbidities that complicate the patient evaluation  Gallstones   Additional history obtained:  Additional history obtained from patient External records from outside source obtained and reviewed including hospital record   Lab Tests:  No lab Imaging Studies ordered:  I ordered imaging studies including right shoulder I independently visualized and interpreted imaging which showed calcified tendon I agree with the radiologist interpretation   Cardiac Monitoring: / EKG:  The patient was maintained on a cardiac monitor.  I personally viewed and interpreted the cardiac monitored which showed an underlying rhythm of: Normal sinus rhythm   Consultations Obtained: No consult  Problem List / ED Course / Critical interventions / Medication management  Shoulder pain I ordered medication including Motrin and Percocet Reevaluation of the patient after these medicines showed that the patient stayed the same I have reviewed the patients home medicines and have made  adjustments as needed   Social Determinants of Health:  None   Test / Admission - Considered:  MRI of the right shoulder as an outpatient may be indicated  Patient with right shoulder pain from calcifications along the greater tuberosity reflecting tendinopathy.  She is given a sling along with anti-inflammatories and pain medicines and will follow-up with orthopedics        Final Clinical Impression(s) / ED Diagnoses Final diagnoses:  Acute pain of right shoulder    Rx / DC Orders ED Discharge Orders          Ordered    ibuprofen (ADVIL) 800 MG tablet  3 times daily        08/26/21 1128    oxyCODONE-acetaminophen (PERCOCET/ROXICET) 5-325 MG tablet  Every 6 hours PRN        08/26/21  1128              Bethann Berkshire, MD 08/26/21 [phone]

## 2021-08-26 NOTE — Discharge Instructions (Signed)
Follow-up with Dr. August Saucer in the next couple weeks.  Take your Motrin 3 times a day

## 2021-08-26 NOTE — ED Notes (Signed)
Patient transported to X-ray 
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
## 2021-08-26 NOTE — ED Notes (Signed)
Dc instructions and scripts reviewed with pt no questions or concerns at this time. Will follow up with ortho  ?

## 2022-01-16 ENCOUNTER — Other Ambulatory Visit (HOSPITAL_COMMUNITY): Payer: Self-pay | Admitting: Family Medicine

## 2022-01-16 DIAGNOSIS — Z1231 Encounter for screening mammogram for malignant neoplasm of breast: Secondary | ICD-10-CM

## 2022-01-30 ENCOUNTER — Ambulatory Visit (HOSPITAL_COMMUNITY)
Admission: RE | Admit: 2022-01-30 | Discharge: 2022-01-30 | Disposition: A | Discharge status: Home / Self Care | Payer: 59 | Source: Ambulatory Visit | Attending: Family Medicine | Admitting: Family Medicine

## 2022-01-30 DIAGNOSIS — Z1231 Encounter for screening mammogram for malignant neoplasm of breast: Secondary | ICD-10-CM | POA: Insufficient documentation

## 2022-05-27 IMAGING — MG MM DIGITAL SCREENING BILAT W/ TOMO AND CAD
8 series · 9 of 24 positions shown · non-contrast
Comparison: Previous exam(s).

CLINICAL DATA: Screening.

EXAM:
DIGITAL SCREENING BILATERAL MAMMOGRAM WITH TOMOSYNTHESIS AND CAD
TECHNIQUE: Bilateral screening digital craniocaudal and mediolateral oblique
mammograms were obtained. Bilateral screening digital breast
tomosynthesis was performed. The images were evaluated with
computer-aided detection.

[R CC synth-2D]
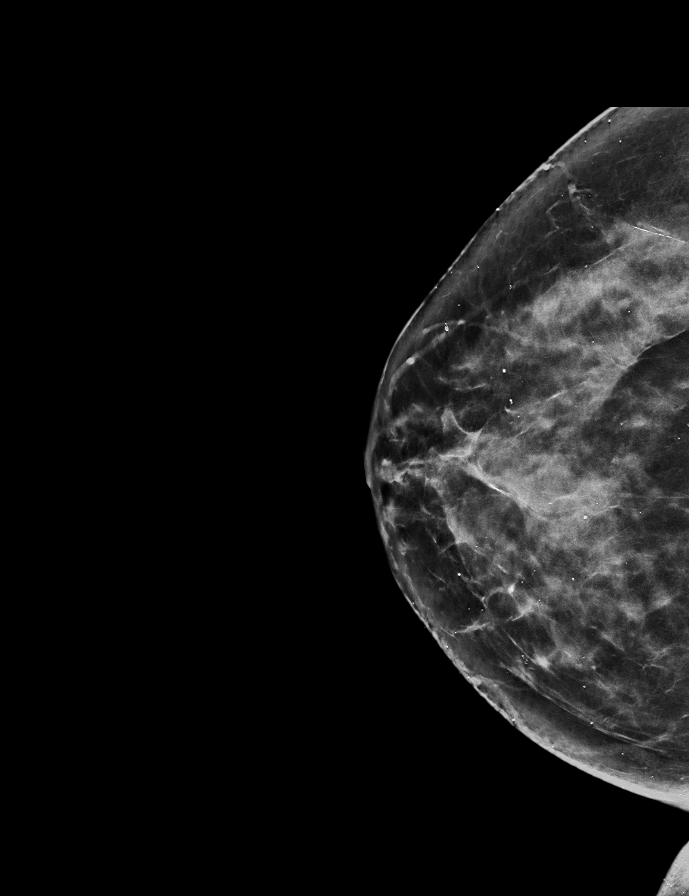

[L MLO synth-2D]
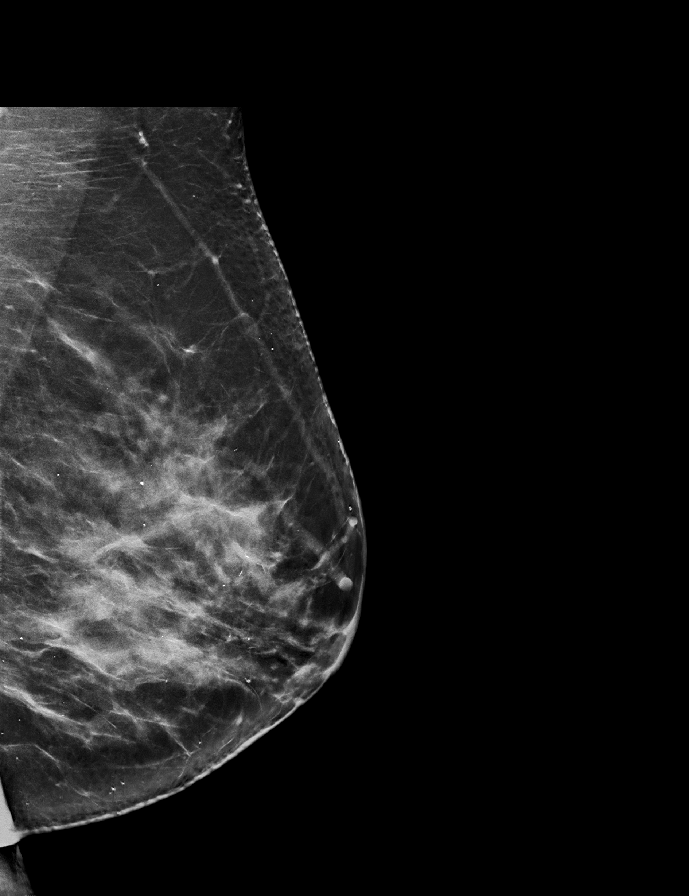

[R MLO synth-2D]
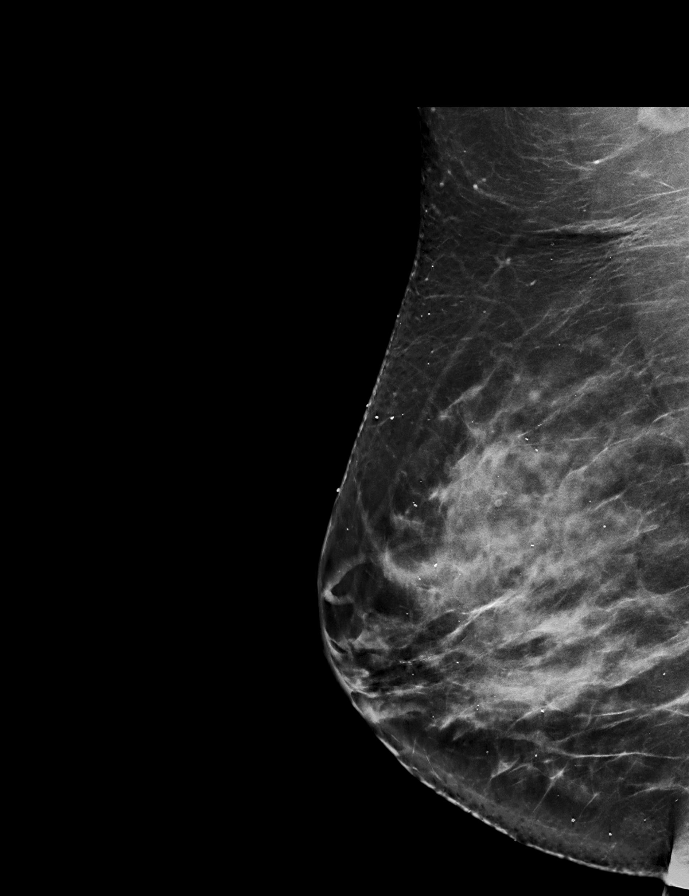

[L CC synth-2D]
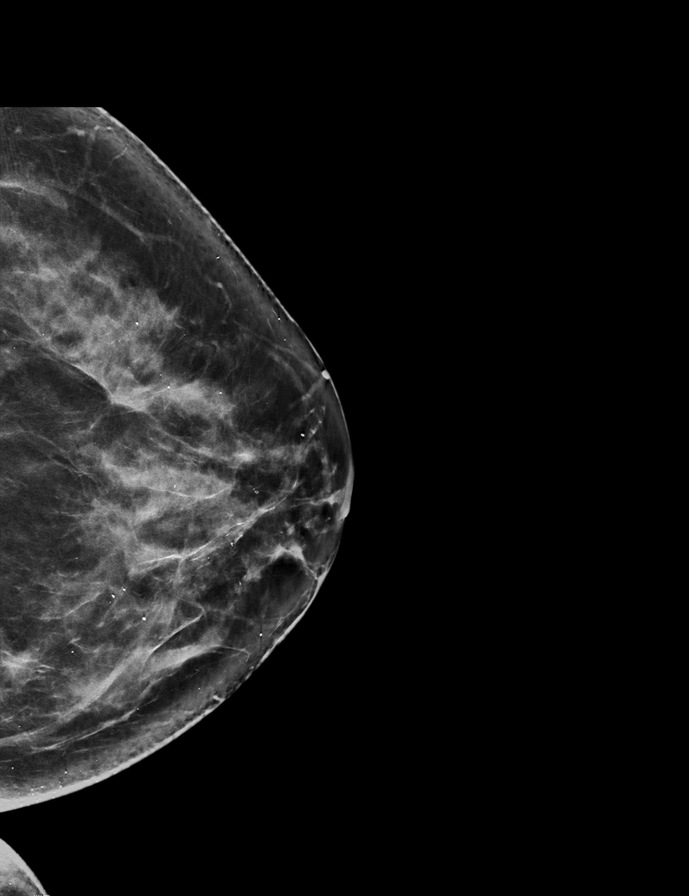

[R CC tomo · 2 of 71 frames shown]
[frame 23/71]
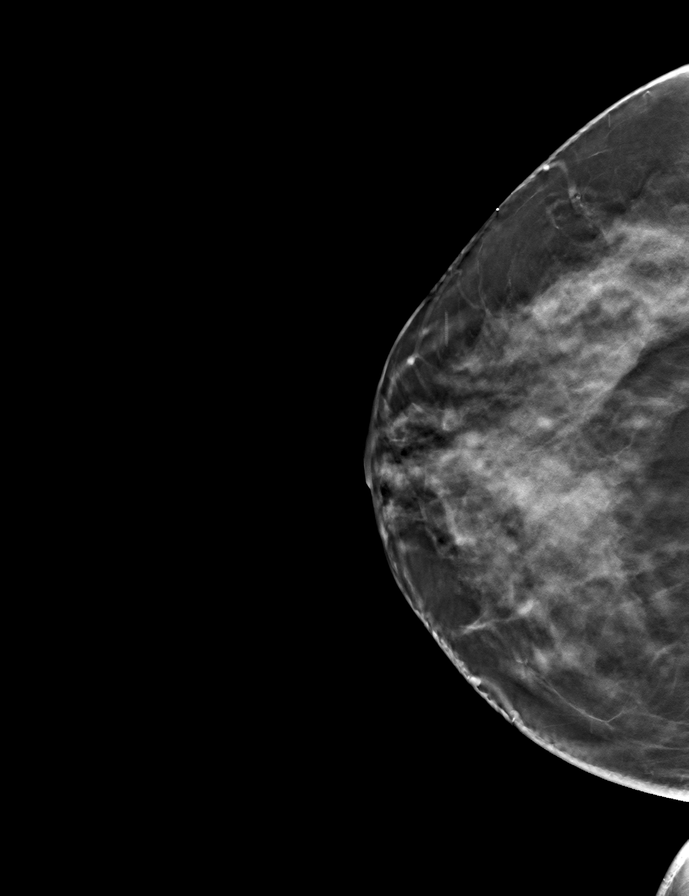
[frame 36/71]
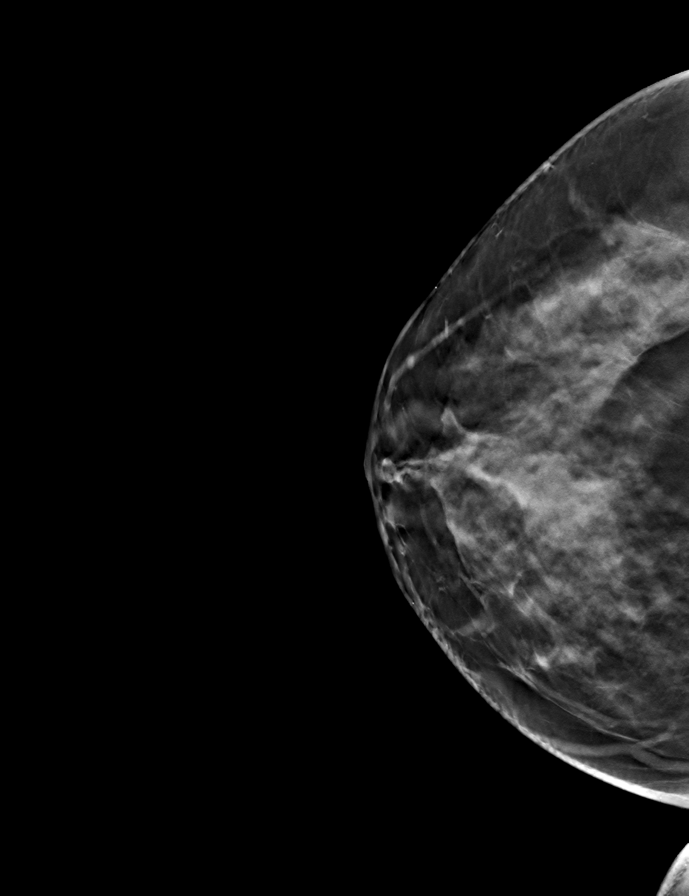

[R MLO tomo · tomo slice 41/81.0]
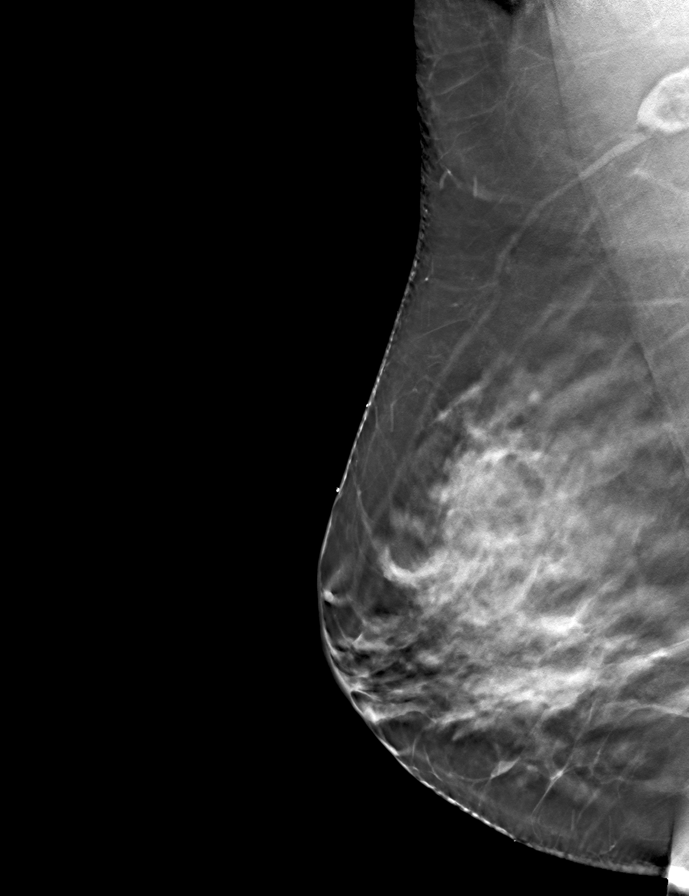

[L CC tomo · tomo slice 37/73.0]
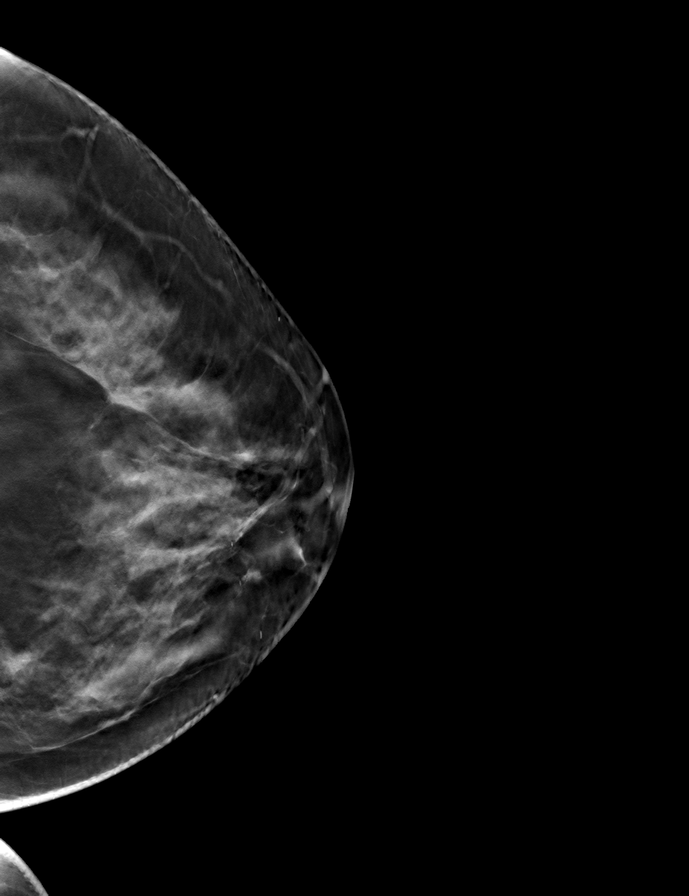

[L MLO tomo · tomo slice 38/75.0]
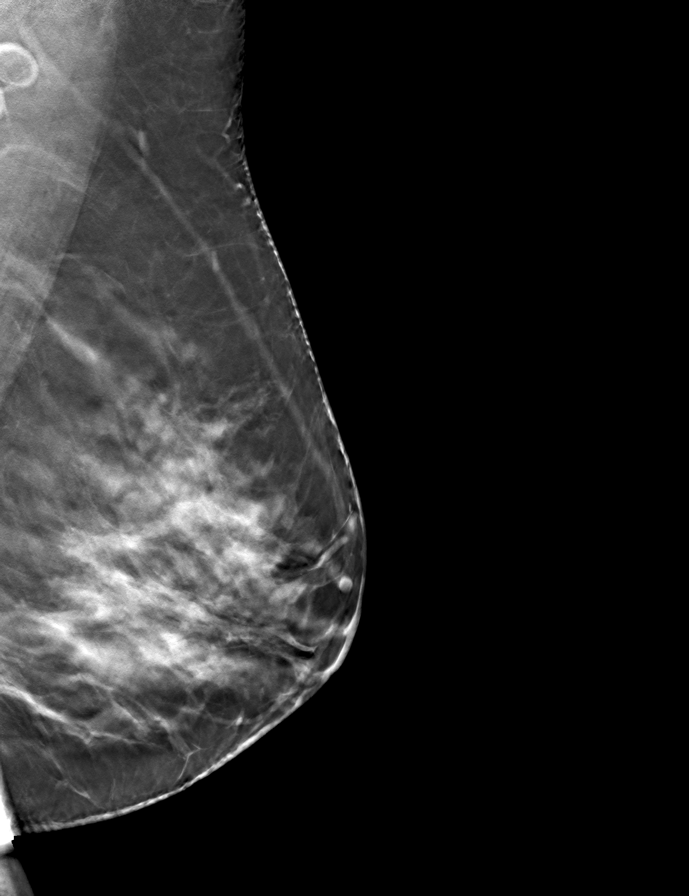

[9 of 24 positions shown; findings below may reference images not displayed]

ACR Breast Density Category c: The breast tissue is heterogeneously
dense, which may obscure small masses.
FINDINGS: There are no findings suspicious for malignancy.
IMPRESSION: No mammographic evidence of malignancy. A result letter of this
screening mammogram will be mailed directly to the patient.

RECOMMENDATION:
Screening mammogram in one year. (Code:Q3-W-BC3)

BI-RADS CATEGORY  1: Negative.

## 2022-09-06 DIAGNOSIS — S39012A Strain of muscle, fascia and tendon of lower back, initial encounter: Secondary | ICD-10-CM | POA: Diagnosis not present

## 2022-09-06 DIAGNOSIS — M549 Dorsalgia, unspecified: Secondary | ICD-10-CM | POA: Diagnosis not present

## 2023-06-04 ENCOUNTER — Other Ambulatory Visit (HOSPITAL_COMMUNITY): Payer: Self-pay | Admitting: Family Medicine

## 2023-06-04 DIAGNOSIS — Z1231 Encounter for screening mammogram for malignant neoplasm of breast: Secondary | ICD-10-CM

## 2023-06-05 ENCOUNTER — Encounter: Payer: Self-pay | Admitting: *Deleted

## 2023-06-25 ENCOUNTER — Encounter (HOSPITAL_COMMUNITY): Payer: Self-pay

## 2023-06-25 ENCOUNTER — Ambulatory Visit (HOSPITAL_COMMUNITY)
Admission: RE | Admit: 2023-06-25 | Discharge: 2023-06-25 | Disposition: A | Discharge status: Home / Self Care | Source: Ambulatory Visit | Attending: Family Medicine | Admitting: Family Medicine

## 2023-06-25 DIAGNOSIS — Z1231 Encounter for screening mammogram for malignant neoplasm of breast: Secondary | ICD-10-CM | POA: Insufficient documentation
